# Patient Record
Sex: Male | Born: 1990 | Race: Black or African American | Hispanic: No | Marital: Single | Smoking: Never smoker
Health system: Southern US, Community
[De-identification: ages and names within clinical notes are randomized; demographics above are authoritative.]

## PROBLEM LIST (undated history)

## (undated) DIAGNOSIS — R55 Syncope and collapse: Secondary | ICD-10-CM

## (undated) DIAGNOSIS — I499 Cardiac arrhythmia, unspecified: Secondary | ICD-10-CM

## (undated) DIAGNOSIS — E119 Type 2 diabetes mellitus without complications: Secondary | ICD-10-CM

## (undated) DIAGNOSIS — R002 Palpitations: Secondary | ICD-10-CM

## (undated) DIAGNOSIS — E669 Obesity, unspecified: Secondary | ICD-10-CM

---

## 2016-09-18 ENCOUNTER — Other Ambulatory Visit: Payer: Self-pay | Admitting: Ophthalmology

## 2016-09-18 DIAGNOSIS — H209 Unspecified iridocyclitis: Secondary | ICD-10-CM

## 2016-09-20 ENCOUNTER — Other Ambulatory Visit
Admission: RE | Admit: 2016-09-20 | Discharge: 2016-09-20 | Disposition: A | Discharge status: Home / Self Care | Payer: 59 | Source: Ambulatory Visit | Attending: Ophthalmology | Admitting: Ophthalmology

## 2016-09-20 DIAGNOSIS — H209 Unspecified iridocyclitis: Secondary | ICD-10-CM | POA: Insufficient documentation

## 2016-09-20 LAB — CREATININE, SERUM: CREATININE: 1.04 mg/dL (ref 0.61–1.24)

## 2016-09-20 LAB — URINALYSIS COMPLETE WITH MICROSCOPIC (ARMC ONLY)
BACTERIA UA: NONE SEEN
BILIRUBIN URINE: NEGATIVE
Glucose, UA: NEGATIVE mg/dL
HGB URINE DIPSTICK: NEGATIVE
Ketones, ur: NEGATIVE mg/dL
Nitrite: NEGATIVE
PH: 5 (ref 5.0–8.0)
PROTEIN: NEGATIVE mg/dL
Specific Gravity, Urine: 1.019 (ref 1.005–1.030)

## 2016-09-21 LAB — RPR: RPR: NONREACTIVE

## 2016-09-21 LAB — FLUORESCENT TREPONEMAL AB(FTA)-IGG-BLD: FLUORESCENT TREPONEMAL AB, IGG: NONREACTIVE

## 2016-09-24 LAB — QUANTIFERON IN TUBE
QFT TB AG MINUS NIL VALUE: 0.03 [IU]/mL
QUANTIFERON MITOGEN VALUE: 8.35 [IU]/mL
QUANTIFERON TB AG VALUE: 0.06 [IU]/mL
QUANTIFERON TB GOLD: NEGATIVE
Quantiferon Nil Value: 0.03 IU/mL

## 2016-09-24 LAB — QUANTIFERON TB GOLD ASSAY (BLOOD)

## 2016-09-25 ENCOUNTER — Ambulatory Visit
Admission: RE | Admit: 2016-09-25 | Discharge: 2016-09-25 | Disposition: A | Discharge status: Home / Self Care | Payer: 59 | Source: Ambulatory Visit | Attending: Ophthalmology | Admitting: Ophthalmology

## 2016-09-25 DIAGNOSIS — H209 Unspecified iridocyclitis: Secondary | ICD-10-CM

## 2016-09-26 LAB — MISC LABCORP TEST (SEND OUT): LABCORP TEST CODE: 6924

## 2017-02-25 ENCOUNTER — Ambulatory Visit
Admission: EM | Admit: 2017-02-25 | Discharge: 2017-02-25 | Disposition: A | Discharge status: Home / Self Care | Payer: 59 | Attending: Family Medicine | Admitting: Family Medicine

## 2017-02-25 DIAGNOSIS — H6982 Other specified disorders of Eustachian tube, left ear: Secondary | ICD-10-CM

## 2017-02-25 MED ORDER — FLUTICASONE PROPIONATE 50 MCG/ACT NA SUSP: 2.0000 | Freq: Every day | NASAL | 0 refills | Status: DC

## 2017-02-25 NOTE — ED Provider Notes (Signed)
CSN: [id number]     Arrival date & time 02/25/17  [id number] History   First MD Initiated Contact with Patient 02/25/17 0914     Chief Complaint  Patient presents with  . Otalgia    left ear   (Consider location/radiation/quality/duration/timing/severity/associated sxs/prior Treatment) HPI  This a 26 year old male who presents with persistent pain in his left ear that is worsened over short period of time. He states that hearing is now muffled. He states that he feels like there is water in there and he can  hear sloshing. Over the counter ear drops and medications have not been beneficial. He denies any immersion in water. He has not been flying or at altitude.     History reviewed. No pertinent past medical history. History reviewed. No pertinent surgical history. Family History  Problem Relation Age of Onset  . Coronary artery disease Father   . Hypertension Father    Social History  Substance Use Topics  . Smoking status: Never Smoker  . Smokeless tobacco: Never Used  . Alcohol use Yes    Review of Systems  Constitutional: Negative for activity change, appetite change, chills, fatigue and fever.  HENT: Positive for ear pain. Negative for ear discharge.   All other systems reviewed and are negative.   Allergies  Patient has no known allergies.  Home Medications   Prior to Admission medications   Medication Sig Start Date End Date Taking? Authorizing Provider  fluticasone (FLONASE) 50 MCG/ACT nasal spray Place 2 sprays into both nostrils daily. 02/25/17   Lutricia FeilWilliam P [place], PA-C   Meds Ordered and Administered this Visit  Medications - No data to display  BP 125/88 (BP Location: Left Arm)   Pulse 81   Temp 98.2 F (36.8 C) (Oral)   Resp 18   Ht 6\' 2"  (1.88 m)   Wt 270 lb (122.5 kg)   SpO2 100%   BMI 34.67 kg/m  No data found.   Physical Exam  Constitutional: He is oriented to person, place, and time. He appears well-developed and well-nourished. No distress.   HENT:  Head: Normocephalic and atraumatic.  Right Ear: External ear normal.  Left Ear: External ear normal.  Left ear canal is clean. There is no cerumen present. TM is normal appearing with a mild decrease of the light reflex and some retraction.  Eyes: EOM are normal. Pupils are equal, round, and reactive to light. Right eye exhibits no discharge. Left eye exhibits no discharge.  Neck: Normal range of motion. Neck supple.  Musculoskeletal: Normal range of motion.  Lymphadenopathy:    He has no cervical adenopathy.  Neurological: He is alert and oriented to person, place, and time.  Skin: Skin is warm and dry. He is not diaphoretic.  Psychiatric: He has a normal mood and affect. His behavior is normal. Judgment and thought content normal.  Nursing note and vitals reviewed.   Urgent Care Course     Procedures (including critical care time)  Labs Review Labs Reviewed - No data to display  Imaging Review No results found.   Visual Acuity Review  Right Eye Distance:   Left Eye Distance:   Bilateral Distance:    Right Eye Near:   Left Eye Near:    Bilateral Near:         MDM   1. Eustachian tube dysfunction, left    Discharge Medication List as of 02/25/2017  9:27 AM    START taking these medications   Details  fluticasone (FLONASE)  50 MCG/ACT nasal spray Place 2 sprays into both nostrils daily., Starting Mon 02/25/2017, Normal      Plan: 1. Test/x-ray results and diagnosis reviewed with patient 2. rx as per orders; risks, benefits, potential side effects reviewed with patient 3. Recommend supportive treatment with Daily use of Flonase which was instructed to the patient. May consider antihistamines for augmentation. Is not improving within 2 weeks he should follow-up with Dr. Genevive Bi. 4. F/u prn if symptoms worsen or don't improve     Lutricia Feil, PA-C 02/25/17 [phone]

## 2017-02-25 NOTE — ED Triage Notes (Addendum)
Pt c/o persistent pain in left ear, and gradually getting worse and having some difficulty hearing. 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 He says it feels like there is water in there. He has tried OTC drops with no relief.

## 2017-12-05 ENCOUNTER — Ambulatory Visit
Admission: EM | Admit: 2017-12-05 | Discharge: 2017-12-05 | Disposition: A | Discharge status: Home / Self Care | Payer: 59 | Attending: Internal Medicine | Admitting: Internal Medicine

## 2017-12-05 ENCOUNTER — Other Ambulatory Visit: Payer: Self-pay

## 2017-12-05 DIAGNOSIS — J029 Acute pharyngitis, unspecified: Secondary | ICD-10-CM

## 2017-12-05 DIAGNOSIS — M542 Cervicalgia: Secondary | ICD-10-CM

## 2017-12-05 HISTORY — DX: Cardiac arrhythmia, unspecified: I49.9

## 2017-12-05 LAB — RAPID STREP SCREEN (MED CTR MEBANE ONLY): Streptococcus, Group A Screen (Direct): NEGATIVE

## 2017-12-05 MED ORDER — PENICILLIN G BENZATHINE 1200000 UNIT/2ML IM SUSP
1.2000 10*6.[IU] | Freq: Once | INTRAMUSCULAR | Status: AC
  Administered 2017-12-05: 1.2 10*6.[IU] via INTRAMUSCULAR

## 2017-12-05 MED ORDER — DEXAMETHASONE SODIUM PHOSPHATE 10 MG/ML IJ SOLN
10.0000 mg | Freq: Once | INTRAMUSCULAR | Status: AC
  Administered 2017-12-05: 10 mg via INTRAMUSCULAR

## 2017-12-05 NOTE — ED Provider Notes (Signed)
[facility]    CSN: [id number] Arrival date & time: 12/05/17  1847     History   Chief Complaint Chief Complaint  Patient presents with  . Sore Throat    HPI Antonio Peters is a 26 y.o. male.   Patient is a 26 year old male who presents with complaint of severe sore throat pain for 8-10 days. Patient's pain is rated 10 out of 10 scale. wife was treated for strep throat last week and the patient's son was found to be positive for strep throat yesterday. Patient also reports fever, headache, neck pain and cough. Patient also reports a tightness from level and noticed down to lower chest. 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 Patient states ibuprofen helps for about one hour which he has been taken more nighttime to help sleep he also been taking garlic water as well as EmergenC. Patient reports severe neck pain and back pain. His throat pain is worse with swallowing and cough. Patient denies any shortness of breath.      Past Medical History:  Diagnosis Date  . Irregular heartbeat     There are no active problems to display for this patient.   History reviewed. No pertinent surgical history.     Home Medications    Prior to Admission medications   Medication Sig Start Date End Date Taking? Authorizing Provider  fluticasone (FLONASE) 50 MCG/ACT nasal spray Place 2 sprays into both nostrils daily. 02/25/17   Lutricia Feiloemer, William P, PA-C    Family History Family History  Problem Relation Age of Onset  . Coronary artery disease Father   . Hypertension Father     Social History Social History   Tobacco Use  . Smoking status: Never Smoker  . Smokeless tobacco: Never Used  Substance Use Topics  . Alcohol use: Yes  . Drug use: No     Allergies   Patient has no known allergies.   Review of Systems Review of Systems  As noted above in  history of present illness. Other systems reviewed and found to be negative.   Physical Exam Triage Vital Signs ED Triage Vitals  Enc Vitals Group     BP 12/05/17 1900 120/80     Pulse Rate 12/05/17 1900 (!) 117     Resp 12/05/17 1900 (!) 22     Temp 12/05/17 1900 99.3 F (37.4 C)     Temp Source 12/05/17 1900 Oral     SpO2 12/05/17 1900 100 %     Weight 12/05/17 1903 275 lb (124.7 kg)     Height 12/05/17 1903 6\' 1"  (1.854 m)     Head Circumference --      Peak Flow --      Pain Score 12/05/17 1903 10     Pain Loc --      Pain Edu? --      Excl. in GC? --    No data found.  Updated Vital Signs BP 120/80 (BP Location: Right Arm)   Pulse (!) 117   Temp 99.3 F (37.4 C) (Oral)   Resp (!) 22   Ht 6\' 1"  (1.854 m)   Wt 275 lb (124.7 kg)   SpO2 100%   BMI 36.28 kg/m    Physical Exam  Constitutional: He is oriented to person, place, and time. He appears well-developed and well-nourished. He appears ill.  HENT:  Head: Normocephalic and atraumatic.  Nose: Right sinus exhibits maxillary sinus tenderness and frontal sinus tenderness. Left sinus exhibits maxillary sinus tenderness and frontal sinus tenderness.  Mouth/Throat: Posterior oropharyngeal erythema (bright red) present. Tonsils are 1+ on the right. Tonsils are 1+ on the left.  Eyes: EOM are normal. Pupils are equal, round, and reactive to light.  Neck:  Tenderness to neck with palpation. Decreased range of motion secondary to pain.  Cardiovascular: Regular rhythm. Tachycardia present.  Pulmonary/Chest: Effort normal and breath sounds normal. No stridor. No respiratory distress. He has no wheezes.  Abdominal: Soft. Bowel sounds are normal.  Lymphadenopathy:    He has cervical adenopathy.  Neurological: He is alert and oriented to person, place, and time.  Skin: Skin is warm and dry.  Question whether actual severe pain or exaggeration in response to palpation of the maxillary sinus with patient nearly coming up out of  the exam chair with palpation. Wife reported to nurse that he seemed to be exaggerating somewhat.   UC Treatments / Results  Labs (all labs ordered are listed, but only abnormal results are displayed) Labs Reviewed  RAPID STRE[facility]CREEN (NOT [facility])  CULTURE, GROUP A STREP [facility])    EKG  EKG Interpretation None       Radiology No results found.  Procedures Procedures (including critical care time)  Medications Ordered in UC Medications  penicillin g benzathine (BICILLIN LA) 1200000 UNIT/2ML injection 1.2 Million Units (not administered)  dexamethasone (DECADRON) injection 10 mg (not administered)     Initial Impression / Assessment and Plan / UC Course  I have reviewed the triage vital signs and the nursing notes.  Pertinent labs & imaging results that were available during my care of the patient were reviewed by me and considered in my medical decision making (see chart for details).    Patient with complaint of severe sore throat 8-9 days much worse the last 7. Wife and child both with positive strep throat. Patient with complaint of neck tenderness as well as tightness around the chest area. Rapid strep done . Final Clinical Impressions(s) / UC Diagnoses   Final diagnoses:  Sore throat   Patient with severe sore throat with swelling and erythema noted. Wife and child both are positive for strep throat are being treated with antibiotics. Plan treat patient with antibiotics and patient has agreed with treatment with Bicillin injection set of pills due to his swallowing pain. Patient also given a injection of Decadron 1 to help with inflammation.  ED Discharge Orders    None       Controlled Substance Prescriptions [place] Controlled Substance Registry consulted? Not Applicable   Candis SchatzHarris, Allyn Bertoni D, PA-C 12/05/17 1931

## 2017-12-05 NOTE — Discharge Instructions (Signed)
-  tylenol and ibuprofen for pain -warm salt water gargles -OTC lozenges -you were given an injection of an antibiotic called Bicillin. This should treat your likely infection -You were also given an injection of dexamethasone. This is a steroid to help with your inflammation.  -return to clinic or follow up with PCP if symptoms do not improve or should worsen.

## 2017-12-05 NOTE — ED Triage Notes (Signed)
Pt's wife diagnosed with strep last week. Pt with sx since last Friday. Severe sore throat and feels swollen. Reports fever. Pain 10/10

## 2017-12-08 LAB — CULTURE, GROUP A STREP (THRC)

## 2018-11-30 ENCOUNTER — Emergency Department
Admission: EM | Admit: 2018-11-30 | Discharge: 2018-11-30 | Disposition: A | Discharge status: Home / Self Care | Payer: PRIVATE HEALTH INSURANCE | Attending: Emergency Medicine | Admitting: Emergency Medicine

## 2018-11-30 ENCOUNTER — Other Ambulatory Visit: Payer: Self-pay

## 2018-11-30 DIAGNOSIS — H209 Unspecified iridocyclitis: Secondary | ICD-10-CM

## 2018-11-30 DIAGNOSIS — Z79899 Other long term (current) drug therapy: Secondary | ICD-10-CM | POA: Insufficient documentation

## 2018-11-30 DIAGNOSIS — H5711 Ocular pain, right eye: Secondary | ICD-10-CM | POA: Diagnosis present

## 2018-11-30 MED ORDER — PREDNISOLONE ACETATE 1 % OP SUSP: 2.0000 [drp] | Freq: Three times a day (TID) | OPHTHALMIC | 0 refills | Status: AC

## 2018-11-30 NOTE — ED Notes (Signed)
No peripheral IV placed this visit.   Discharge instructions reviewed with patient. Questions fielded by this RN. Patient verbalizes understanding of instructions. Patient discharged home in stable condition per Jackie, PA. No acute distress noted at time of discharge.   

## 2018-11-30 NOTE — ED Provider Notes (Signed)
[facility] Emergency Department Provider Note  ____________________________________________  Time seen: Approximately 7:19 PM  I have reviewed the triage vital signs and the nursing notes.   HISTORY  Chief Complaint Eye Pain    HPI Antonio Peters is a 27 y.o. male presents to the emergency department with right eye pain for the past 2 to 3 days.  Patient has a history of uveitis and has experienced similar symptoms in the past.  Patient has light sensitivity but no increased tearing.  No foreign body sensation or crusting or matting of the eye lashes.  Patient denies right eye trauma.  He does not wear contact lenses.  Patient reports that he recently moved to [place] from [place] and was previously under the care of ophthalmology.  Patient reports that he underwent an extensive work-up to be diagnosed with uveitis and was managed with a topical steroid drop.  No visual loss since symptoms started.  No alleviating measures have been attempted.   Past Medical History:  Diagnosis Date  . Irregular heartbeat   . Irregular heartbeat     There are no active problems to display for this patient.   History reviewed. No pertinent surgical history.  Prior to Admission medications   Medication Sig Start Date End Date Taking? Authorizing Provider  fluticasone (FLONASE) 50 MCG/ACT nasal spray Place 2 sprays into both nostrils daily. 02/25/17   Lutricia Feil, PA-C  prednisoLONE acetate (PRED FORTE) 1 % ophthalmic suspension Place 2 drops into the right eye 3 (three) times daily for 5 days. 11/30/18 12/05/18  Orvil Feil, PA-C    Allergies Patient has no known allergies.  Family History  Problem Relation Age of Onset  . Coronary artery disease Father   . Hypertension Father     Social History Social History   Tobacco Use  . Smoking status: Never Smoker  . Smokeless tobacco: Never Used  Substance Use Topics  . Alcohol use: Not Currently  .  Drug use: No     Review of Systems  Constitutional: No fever/chills Eyes: Patient has right eye pain. ENT: No upper respiratory complaints. Cardiovascular: no chest pain. Respiratory: no cough. No SOB. Gastrointestinal: No abdominal pain.  No nausea, no vomiting.  No diarrhea.  No constipation. Genitourinary: Negative for dysuria. No hematuria Musculoskeletal: Negative for musculoskeletal pain. Skin: Negative for rash, abrasions, lacerations, ecchymosis. Neurological: Negative for headaches, focal weakness or numbness.  ____________________________________________   PHYSICAL EXAM:  VITAL SIGNS: ED Triage Vitals  Enc Vitals Group     BP 11/30/18 1657 121/84     Pulse Rate 11/30/18 1657 94     Resp 11/30/18 1657 17     Temp 11/30/18 1657 98.2 F (36.8 C)     Temp Source 11/30/18 1657 Oral     SpO2 11/30/18 1657 97 %     Weight 11/30/18 1658 270 lb (122.5 kg)     Height 11/30/18 1658 6\' 3"  (1.905 m)     Head Circumference --      Peak Flow --      Pain Score 11/30/18 1657 7     Pain Loc --      Pain Edu? --      Excl. in GC? --      Constitutional: Alert and oriented. Well appearing and in no acute distress. Eyes: Patient has perilimbal redness and scleral injection of the right eye.  PERRL. EOMI. accommodation intact.  Peripheral vision intact. Head: Atraumatic. ENT:  Ears: TMs are pearly.      Nose: No congestion/rhinnorhea.      Mouth/Throat: Mucous membranes are moist.  Neck: No stridor.  No cervical spine tenderness to palpation. Hematological/Lymphatic/Immunilogical: No cervical lymphadenopathy.  Cardiovascular: Normal rate, regular rhythm. Normal S1 and S2.  Good peripheral circulation. Respiratory: Normal respiratory effort without tachypnea or retractions. Lungs CTAB. Good air entry to the bases with no decreased or absent breath sounds. Skin:  Skin is warm, dry and intact. No rash noted. Psychiatric: Mood and affect are normal. Speech and behavior are  normal. Patient exhibits appropriate insight and judgement.   ____________________________________________   LABS (all labs ordered are listed, but only abnormal results are displayed)  Labs Reviewed - No data to display ____________________________________________  EKG   ____________________________________________  RADIOLOGY   No results found.  ____________________________________________    PROCEDURES  Procedure(s) performed:    Procedures    Medications - No data to display   ____________________________________________   INITIAL IMPRESSION / ASSESSMENT AND PLAN / ED COURSE  Pertinent labs & imaging results that were available during my care of the patient were reviewed by me and considered in my medical decision making (see chart for details).  Review of the [place] CSRS was performed in accordance of the [facility] prior to dispensing any controlled drugs.      Assessment and plan Uveitis Patient presents to the emergency department with right eye photophobia and discomfort for the past 2 to 3 days.  On physical exam, patient had perilimbal redness and scleral injection. Patient reports that he has experienced the exact same symptoms in the past when diagnosed with uveitis.  Patient denies changes in visual acuity or foreign body sensation.  Patient was discharged with prednisolone ophthalmic solution as patient has tolerated medication well in the past.  Patient was referred to ophthalmology.  Vital signs are reassuring prior to discharge.  All patient questions were answered.    ____________________________________________  FINAL CLINICAL IMPRESSION(S) / ED DIAGNOSES  Final diagnoses:  Uveitis      NEW MEDICATIONS STARTED DURING THIS VISIT:  ED Discharge Orders         Ordered    prednisoLONE acetate (PRED FORTE) 1 % ophthalmic suspension  3 times daily     11/30/18 1917              This chart was dictated using voice recognition  software/Dragon. Despite best efforts to proofread, errors can occur which can change the meaning. Any change was purely unintentional.    Orvil FeilWoods, Yelina Sarratt M, PA-C 11/30/18 1924    Jene EveryKinner, Robert, MD 11/30/18 2016

## 2018-11-30 NOTE — ED Notes (Signed)
Pt states right eye pain/discomfort. Pt states sensitivity to light. Pt denies vision changes.

## 2018-11-30 NOTE — ED Triage Notes (Signed)
Pt c/o sensitivity to light in right eye only - pt right eye is red and has been since Thursday - denies drainage - denies itching or irritation

## 2020-08-17 ENCOUNTER — Other Ambulatory Visit: Payer: PRIVATE HEALTH INSURANCE

## 2020-08-17 ENCOUNTER — Other Ambulatory Visit: Payer: Self-pay

## 2020-08-17 DIAGNOSIS — Z20822 Contact with and (suspected) exposure to covid-19: Secondary | ICD-10-CM | POA: Diagnosis not present

## 2020-08-18 LAB — NOVEL CORONAVIRUS, NAA: SARS-CoV-2, NAA: DETECTED — AB

## 2020-08-19 ENCOUNTER — Encounter: Payer: Self-pay | Admitting: Oncology

## 2020-08-19 ENCOUNTER — Telehealth: Payer: Self-pay | Admitting: Oncology

## 2020-08-19 NOTE — Telephone Encounter (Signed)
Called to Discuss with patient about Covid symptoms and the use of regeneron, a monoclonal antibody infusion for those with mild to moderate Covid symptoms and at a high risk of hospitalization.     Pt is qualified for this infusion at the [facility] infusion center due to co-morbid conditions and/or a member of an at-risk group.     Unable to reach pt. Left message to return call and Mychart message.   Past Medical History:  Diagnosis Date  . Irregular heartbeat   . Irregular heartbeat    Mignon Pine, AGNP-C [phone] (Infusion Center Hotline)

## 2020-08-24 ENCOUNTER — Other Ambulatory Visit: Payer: PRIVATE HEALTH INSURANCE

## 2020-08-24 ENCOUNTER — Other Ambulatory Visit: Payer: Self-pay

## 2020-08-24 DIAGNOSIS — Z20822 Contact with and (suspected) exposure to covid-19: Secondary | ICD-10-CM

## 2020-08-26 LAB — NOVEL CORONAVIRUS, NAA: SARS-CoV-2, NAA: DETECTED — AB

## 2020-08-26 LAB — SARS-COV-2, NAA 2 DAY TAT

## 2020-08-29 ENCOUNTER — Other Ambulatory Visit: Payer: Self-pay

## 2020-08-29 ENCOUNTER — Other Ambulatory Visit: Payer: Self-pay | Admitting: Critical Care Medicine

## 2020-08-29 DIAGNOSIS — Z20822 Contact with and (suspected) exposure to covid-19: Secondary | ICD-10-CM | POA: Diagnosis not present

## 2020-08-30 LAB — SARS-COV-2, NAA 2 DAY TAT

## 2020-08-30 LAB — NOVEL CORONAVIRUS, NAA: SARS-CoV-2, NAA: NOT DETECTED

## 2021-07-22 ENCOUNTER — Other Ambulatory Visit: Payer: Self-pay

## 2021-07-22 ENCOUNTER — Ambulatory Visit
Admission: EM | Admit: 2021-07-22 | Discharge: 2021-07-22 | Disposition: A | Discharge status: Home / Self Care | Payer: BC Managed Care – PPO | Attending: Sports Medicine | Admitting: Sports Medicine

## 2021-07-22 DIAGNOSIS — L299 Pruritus, unspecified: Secondary | ICD-10-CM

## 2021-07-22 DIAGNOSIS — T7840XA Allergy, unspecified, initial encounter: Secondary | ICD-10-CM

## 2021-07-22 DIAGNOSIS — L235 Allergic contact dermatitis due to other chemical products: Secondary | ICD-10-CM

## 2021-07-22 DIAGNOSIS — R21 Rash and other nonspecific skin eruption: Secondary | ICD-10-CM

## 2021-07-22 HISTORY — DX: Obesity, unspecified: E66.9

## 2021-07-22 MED ORDER — HYDROXYZINE HCL 25 MG PO TABS: 25.0000 mg | ORAL_TABLET | Freq: Four times a day (QID) | ORAL | 0 refills | Status: DC

## 2021-07-22 MED ORDER — METHYLPREDNISOLONE SODIUM SUCC 125 MG IJ SOLR
125.0000 mg | Freq: Once | INTRAMUSCULAR | Status: AC
  Administered 2021-07-22: 125 mg via INTRAMUSCULAR

## 2021-07-22 MED ORDER — PREDNISONE 10 MG (21) PO TBPK: ORAL_TABLET | Freq: Every day | ORAL | 0 refills | Status: DC

## 2021-07-22 NOTE — Discharge Instructions (Addendum)
As we discussed, I gave you a steroid shot today. Also prescribed 12 days of steroids as a taper.  Please complete the entire course. I prescribed you a medication for your itch that is nonsedating that you can use during the daytime.  You can use Benadryl at night as it can make you little sleepy. Educational handouts provided. If you develop any fevers or the rash gets infected please come back here or go to another healthcare provider.  If it is after hours please go to the ER.

## 2021-07-22 NOTE — ED Triage Notes (Addendum)
Pt is here with a rash all over his body this started Monday, pt has taken Benadryl and cortisone cream to relieve discomfort. Pt states when he sweats it stings all over.

## 2021-07-22 NOTE — ED Provider Notes (Signed)
[facility]    CSN: [id number] Arrival date & time: 07/22/21  1242      History   Chief Complaint Chief Complaint  Patient presents with   Rash    HPI Antonio Peters is a 30 y.o. male.   30 year old male who presents for evaluation of a rash that began almost a week ago.  It is all over his body but worse on his arms, his upper back, and his lower back.  He has been taking some Benadryl and using some cortisone over-the-counter cream which he has had limited success with.  He reports that it does sting if he sweats a lot.  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  It is very itchy.  He relates it to a change that he made in detergents.  No other exposures that he is aware of.  No shortness of breath, lip swelling, difficulty swallowing, chest pain, or any symptoms of anaphylaxis.  No fever shakes chills.  No abscess formation.  No draining of any of the areas on his body.  He has never had this before.  He does not have a primary care provider.  No red flag signs or symptoms elicited on history.   Past Medical History:  Diagnosis Date   Irregular heartbeat    Irregular heartbeat    Obese     There are no problems to display for this patient.   History reviewed. No pertinent surgical history.     Home Medications    Prior to Admission medications   Medication Sig Start Date End Date Taking? Authorizing Provider  hydrOXYzine (ATARAX/VISTARIL) 25 MG tablet Take 1 tablet (25 mg total) by mouth every 6 (six) hours. 07/22/21  Yes Delton See, MD  predniSONE (STERAPRED UNI-PAK 21 TAB) 10 MG (21) TBPK tablet Take by mouth daily. Take 6 tabs by mouth daily  for 2 days, then 5 tabs for 2 days, then 4 tabs for 2 days, then 3 tabs for 2 days, 2 tabs for 2 days, then 1 tab by mouth daily for 2 days 07/22/21  Yes Delton See, MD  fluticasone [facility]) 50 MCG/ACT nasal spray Place 2 sprays into both nostrils daily. 02/25/17   Lutricia Feil, PA-C    Family History Family History  Problem Relation Age of Onset   Hypertension Mother    Coronary artery disease Father    Hypertension Father     Social History Social History   Tobacco Use   Smoking status: Never   Smokeless tobacco: Never  Vaping Use   Vaping Use: Never used  Substance Use Topics   Alcohol use: Not Currently   Drug use: No     Allergies   Patient has no known allergies.   Review of Systems Review of Systems  Constitutional:  Negative for activity change, appetite change, chills, diaphoresis, fatigue and fever.  HENT:  Negative for congestion, ear pain, postnasal drip, rhinorrhea, sinus pressure, sinus pain, sneezing, sore throat and trouble swallowing.   Eyes:  Negative for pain.  Respiratory:  Negative for cough, chest tightness, shortness of breath and wheezing.   Cardiovascular:  Negative for chest pain and palpitations.  Gastrointestinal:  Negative for abdominal pain, diarrhea, nausea and vomiting.  Genitourinary:  Negative for dysuria.  Musculoskeletal:  Negative for back pain, myalgias and neck pain.  Skin:  Positive for rash. Negative for color change, pallor and wound.  Neurological:  Negative  for dizziness, light-headedness, numbness and headaches.  All other systems reviewed and are negative.   Physical Exam Triage Vital Signs ED Triage Vitals  Enc Vitals Group     BP 07/22/21 1252 136/80     Pulse Rate 07/22/21 1252 100     Resp 07/22/21 1252 19     Temp 07/22/21 1252 98.6 F (37 C)     Temp Source 07/22/21 1252 Oral     SpO2 07/22/21 1252 99 %     Weight 07/22/21 1258 295 lb (133.8 kg)     Height --      Head Circumference --      Peak Flow --      Pain Score 07/22/21 1251 0     Pain Loc --      Pain Edu? --      Excl. in GC? --    No data found.  Updated Vital Signs BP 136/80 (BP Location: Right Arm)   Pulse 100   Temp 98.6 F (37 C) (Oral)   Resp 19   Wt 133.8 kg   SpO2 99%   BMI 36.87 kg/m    Visual Acuity Right Eye Distance:   Left Eye Distance:   Bilateral Distance:    Right Eye Near:   Left Eye Near:    Bilateral Near:     Physical Exam Vitals and nursing note reviewed.  Constitutional:      General: He is not in acute distress.    Appearance: Normal appearance. He is not ill-appearing, toxic-appearing or diaphoretic.  HENT:     Head: Normocephalic and atraumatic.     Nose: Nose normal. No congestion or rhinorrhea.     Mouth/Throat:     Mouth: Mucous membranes are moist.  Eyes:     Conjunctiva/sclera: Conjunctivae normal.     Pupils: Pupils are equal, round, and reactive to light.  Cardiovascular:     Rate and Rhythm: Normal rate and regular rhythm.     Pulses: Normal pulses.     Heart sounds: Normal heart sounds. No murmur heard.   No friction rub. No gallop.  Pulmonary:     Effort: Pulmonary effort is normal.     Breath sounds: Normal breath sounds. No stridor. No wheezing, rhonchi or rales.  Musculoskeletal:     Cervical back: Normal range of motion and neck supple.  Skin:    General: Skin is warm and dry.     Capillary Refill: Capillary refill takes less than 2 seconds.     Coloration: Skin is not jaundiced.     Findings: Rash present. No bruising, ecchymosis, erythema, signs of injury or wound.     Comments: Diffuse rash on arms, shoulders, and entire back.  It is a little bit raised.  No drainage or abscess formation.  Is consistent with an allergic reaction and contact dermatitis.  Neurological:     General: No focal deficit present.     Mental Status: He is alert and oriented to person, place, and time.     UC Treatments / Results  Labs (all labs ordered are listed, but only abnormal results are displayed) Labs Reviewed - No data to display  EKG   Radiology No results found.  Procedures Procedures (including critical care time)  Medications Ordered in UC Medications  methylPREDNISolone sodium succinate (SOLU-MEDROL) 125 mg/2 mL  injection 125 mg (125 mg Intramuscular Given 07/22/21 1420)    Initial Impression / Assessment and Plan / UC Course  I have reviewed  the triage vital signs and the nursing notes.  Pertinent labs & imaging results that were available during my care of the patient were reviewed by me and considered in my medical decision making (see chart for details).  Clinical impression: 1.  Diffuse rash 2.  Contact dermatitis 3.  Allergic reaction 4.  Pruritus.  Treatment plan: 1.  The findings and treatment plan were discussed in detail with the patient.  Patient was in agreement. 2.  Given the diffuseness of the rash I would go to treat him with steroids.  Solu-Medrol 125 mg IM was given in the office.  I also prescribed a 12-day prednisone taper that he will begin tomorrow. 3.  Gave him Atarax for his itch that he can use during the daytime.  He can use Benadryl oral at nighttime.  Clearly the diffuseness he does not need to be using a topical steroid or Benadryl at this time. 4.  Educational handouts provided. 5.  I did go over the fact if he develops any fevers or he thinks the rash is getting infected that he should come back here or go to another healthcare provider as he does not have a primary care provider.  If it was after hours he should go to the emergency room.  He voiced verbal understanding. 6.  He was discharged in stable condition and will follow-up here as needed.    Final Clinical Impressions(s) / UC Diagnoses   Final diagnoses:  Allergic reaction, initial encounter  Rash and nonspecific skin eruption  Allergic dermatitis due to other chemical product  Pruritus     Discharge Instructions      As we discussed, I gave you a steroid shot today. Also prescribed 12 days of steroids as a taper.  Please complete the entire course. I prescribed you a medication for your itch that is nonsedating that you can use during the daytime.  You can use Benadryl at night as it can make you  little sleepy. Educational handouts provided. If you develop any fevers or the rash gets infected please come back here or go to another healthcare provider.  If it is after hours please go to the ER.   ED Prescriptions     Medication Sig Dispense Auth. Provider   predniSONE (STERAPRED UNI-PAK 21 TAB) 10 MG (21) TBPK tablet Take by mouth daily. Take 6 tabs by mouth daily  for 2 days, then 5 tabs for 2 days, then 4 tabs for 2 days, then 3 tabs for 2 days, 2 tabs for 2 days, then 1 tab by mouth daily for 2 days 42 tablet Delton See, MD   hydrOXYzine (ATARAX/VISTARIL) 25 MG tablet Take 1 tablet (25 mg total) by mouth every 6 (six) hours. 12 tablet Delton See, MD      PDMP not reviewed this encounter.   Delton See, MD 07/22/21 [phone]

## 2021-08-06 ENCOUNTER — Emergency Department: Payer: BC Managed Care – PPO

## 2021-08-06 ENCOUNTER — Other Ambulatory Visit: Payer: Self-pay

## 2021-08-06 ENCOUNTER — Inpatient Hospital Stay
Admission: EM | Admit: 2021-08-06 | Discharge: 2021-08-08 | DRG: 684 | Disposition: A | Discharge status: Home / Self Care | Payer: BC Managed Care – PPO | Attending: Internal Medicine | Admitting: Internal Medicine

## 2021-08-06 ENCOUNTER — Inpatient Hospital Stay: Payer: BC Managed Care – PPO

## 2021-08-06 DIAGNOSIS — R531 Weakness: Secondary | ICD-10-CM | POA: Diagnosis not present

## 2021-08-06 DIAGNOSIS — Z6837 Body mass index (BMI) 37.0-37.9, adult: Secondary | ICD-10-CM

## 2021-08-06 DIAGNOSIS — E669 Obesity, unspecified: Secondary | ICD-10-CM

## 2021-08-06 DIAGNOSIS — R42 Dizziness and giddiness: Secondary | ICD-10-CM | POA: Diagnosis not present

## 2021-08-06 DIAGNOSIS — Z8249 Family history of ischemic heart disease and other diseases of the circulatory system: Secondary | ICD-10-CM | POA: Diagnosis not present

## 2021-08-06 DIAGNOSIS — R739 Hyperglycemia, unspecified: Secondary | ICD-10-CM | POA: Diagnosis present

## 2021-08-06 DIAGNOSIS — I493 Ventricular premature depolarization: Secondary | ICD-10-CM | POA: Diagnosis not present

## 2021-08-06 DIAGNOSIS — R55 Syncope and collapse: Secondary | ICD-10-CM | POA: Diagnosis present

## 2021-08-06 DIAGNOSIS — E161 Other hypoglycemia: Secondary | ICD-10-CM | POA: Diagnosis not present

## 2021-08-06 DIAGNOSIS — H538 Other visual disturbances: Secondary | ICD-10-CM | POA: Diagnosis not present

## 2021-08-06 DIAGNOSIS — R079 Chest pain, unspecified: Secondary | ICD-10-CM | POA: Diagnosis not present

## 2021-08-06 DIAGNOSIS — N179 Acute kidney failure, unspecified: Secondary | ICD-10-CM | POA: Diagnosis not present

## 2021-08-06 DIAGNOSIS — Z20822 Contact with and (suspected) exposure to covid-19: Secondary | ICD-10-CM | POA: Diagnosis present

## 2021-08-06 DIAGNOSIS — R7989 Other specified abnormal findings of blood chemistry: Secondary | ICD-10-CM | POA: Diagnosis not present

## 2021-08-06 DIAGNOSIS — R21 Rash and other nonspecific skin eruption: Secondary | ICD-10-CM | POA: Diagnosis not present

## 2021-08-06 DIAGNOSIS — R0789 Other chest pain: Secondary | ICD-10-CM | POA: Diagnosis present

## 2021-08-06 DIAGNOSIS — R002 Palpitations: Secondary | ICD-10-CM | POA: Diagnosis not present

## 2021-08-06 DIAGNOSIS — R944 Abnormal results of kidney function studies: Secondary | ICD-10-CM | POA: Diagnosis not present

## 2021-08-06 DIAGNOSIS — E162 Hypoglycemia, unspecified: Secondary | ICD-10-CM | POA: Diagnosis not present

## 2021-08-06 HISTORY — DX: Palpitations: R00.2

## 2021-08-06 HISTORY — DX: Syncope and collapse: R55

## 2021-08-06 HISTORY — DX: Type 2 diabetes mellitus without complications: E11.9

## 2021-08-06 LAB — TSH: TSH: 1.361 u[IU]/mL (ref 0.350–4.500)

## 2021-08-06 LAB — CBC
HCT: 40.9 % (ref 39.0–52.0)
Hemoglobin: 14.3 g/dL (ref 13.0–17.0)
MCH: 27 pg (ref 26.0–34.0)
MCHC: 35 g/dL (ref 30.0–36.0)
MCV: 77.2 fL — ABNORMAL LOW (ref 80.0–100.0)
Platelets: 341 10*3/uL (ref 150–400)
RBC: 5.3 MIL/uL (ref 4.22–5.81)
RDW: 14.7 % (ref 11.5–15.5)
WBC: 11.7 10*3/uL — ABNORMAL HIGH (ref 4.0–10.5)
nRBC: 0 % (ref 0.0–0.2)

## 2021-08-06 LAB — URINALYSIS, ROUTINE W REFLEX MICROSCOPIC
Bilirubin Urine: NEGATIVE
Glucose, UA: NEGATIVE mg/dL
Hgb urine dipstick: NEGATIVE
Ketones, ur: NEGATIVE mg/dL
Leukocytes,Ua: NEGATIVE
Nitrite: NEGATIVE
Protein, ur: NEGATIVE mg/dL
Specific Gravity, Urine: 1.025 (ref 1.005–1.030)
pH: 7 (ref 5.0–8.0)

## 2021-08-06 LAB — BASIC METABOLIC PANEL
Anion gap: 7 (ref 5–15)
BUN: 19 mg/dL (ref 6–20)
CO2: 23 mmol/L (ref 22–32)
Calcium: 9.1 mg/dL (ref 8.9–10.3)
Chloride: 108 mmol/L (ref 98–111)
Creatinine, Ser: 1.39 mg/dL — ABNORMAL HIGH (ref 0.61–1.24)
GFR, Estimated: >60 mL/min (ref >60)
Glucose, Bld: 109 mg/dL — ABNORMAL HIGH (ref 70–99)
Potassium: 3.7 mmol/L (ref 3.5–5.1)
Sodium: 138 mmol/L (ref 135–145)

## 2021-08-06 LAB — HEPATIC FUNCTION PANEL
ALT: 31 U/L (ref 0–44)
AST: 26 U/L (ref 15–41)
Albumin: 3.6 g/dL (ref 3.5–5.0)
Alkaline Phosphatase: 44 U/L (ref 38–126)
Bilirubin, Direct: 0.1 mg/dL (ref 0.0–0.2)
Indirect Bilirubin: 0.8 mg/dL (ref 0.3–0.9)
Total Bilirubin: 0.9 mg/dL (ref 0.3–1.2)
Total Protein: 6.9 g/dL (ref 6.5–8.1)

## 2021-08-06 LAB — TROPONIN I (HIGH SENSITIVITY)
Troponin I (High Sensitivity): 5 ng/L (ref <18)
Troponin I (High Sensitivity): 5 ng/L (ref <18)

## 2021-08-06 LAB — CBG MONITORING, ED: Glucose-Capillary: 120 mg/dL — ABNORMAL HIGH (ref 70–99)

## 2021-08-06 LAB — T4, FREE: Free T4: 1.08 ng/dL (ref 0.61–1.12)

## 2021-08-06 LAB — D-DIMER, QUANTITATIVE: D-Dimer, Quant: 1.29 ug/mL-FEU — ABNORMAL HIGH (ref 0.00–0.50)

## 2021-08-06 LAB — MAGNESIUM: Magnesium: 2.1 mg/dL (ref 1.7–2.4)

## 2021-08-06 LAB — SEDIMENTATION RATE: Sed Rate: 4 mm/hr (ref 0–15)

## 2021-08-06 LAB — FERRITIN: Ferritin: 191 ng/mL (ref 24–336)

## 2021-08-06 MED ORDER — HEPARIN SODIUM (PORCINE) 5000 UNIT/ML IJ SOLN
5000.0000 [IU] | Freq: Three times a day (TID) | INTRAMUSCULAR | Status: DC
  Filled 2021-08-06 (×2): qty 1

## 2021-08-06 MED ORDER — SODIUM CHLORIDE 0.9% FLUSH
3.0000 mL | Freq: Two times a day (BID) | INTRAVENOUS | Status: DC
  Administered 2021-08-06 – 2021-08-07 (×2): 3 mL via INTRAVENOUS

## 2021-08-06 MED ORDER — SODIUM CHLORIDE 0.9 % IV SOLN: INTRAVENOUS | Status: AC

## 2021-08-06 MED ORDER — LACTATED RINGERS IV BOLUS
1000.0000 mL | Freq: Once | INTRAVENOUS | Status: AC
  Administered 2021-08-06: 1000 mL via INTRAVENOUS

## 2021-08-06 NOTE — ED Provider Notes (Signed)
[facility] Emergency Department Provider Note   ____________________________________________    I have reviewed the triage vital signs and the nursing notes.   HISTORY  Chief Complaint Chest Pain     HPI Antonio Peters is a 30 y.o. male with a reported history of diabetes although patient is not on any medications presents with complaints of chest discomfort palpitations near syncope, diaphoresis.  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  Is feeling somewhat better now, no longer having chest discomfort or palpitations but still feels extremely weak.  He reports similar episode occurred 1 week ago but not nearly as bad  Past Medical History:  Diagnosis Date   Diabetes mellitus without complication (HCC)    Irregular heartbeat    Irregular heartbeat    Obese     Patient Active Problem List   Diagnosis Date Noted   Syncope and collapse 08/06/2021   Dizziness 08/06/2021   Blurred vision, left eye 08/06/2021   Rash 08/06/2021   Abnormal renal function test 08/06/2021   Elevated serum glucose 08/06/2021   Obesity 08/06/2021    History reviewed. No pertinent surgical history.  Prior to Admission medications   Medication Sig Start Date End Date Taking? Authorizing Provider  fluticasone (FLONASE) 50 MCG/ACT nasal spray Place 2 sprays into both nostrils daily. Patient not taking: Reported on 08/06/2021 02/25/17   Lutricia Feil, PA-C  hydrOXYzine (ATARAX/VISTARIL) 25 MG tablet Take 1 tablet (25 mg total) by mouth every 6 (six) hours. Patient not taking: Reported on 08/06/2021 07/22/21   Delton See, MD  predniSONE (STERAPRED UNI-PAK 21 TAB) 10 MG (21) TBPK tablet Take by mouth daily. Take 6 tabs by mouth daily  for 2 days, then 5 tabs for 2 days, then 4 tabs for 2 days, then 3 tabs for  2 days, 2 tabs for 2 days, then 1 tab by mouth daily for 2 days Patient not taking: Reported on 08/06/2021 07/22/21   Delton See, MD     Allergies Patient has no known allergies.  Family History  Problem Relation Age of Onset   Hypertension Mother    Coronary artery disease Father    Hypertension Father     Social History Social History   Tobacco Use   Smoking status: Never   Smokeless tobacco: Never  Vaping Use   Vaping Use: Never used  Substance Use Topics   Alcohol use: Not Currently   Drug use: No    Review of Systems  Constitutional: No fever/chills Eyes: No visual changes.  ENT: No sore throat. Cardiovascular: As above Respiratory: Denies shortness of breath. Gastrointestinal: No abdominal pain.  No nausea, no vomiting.   Genitourinary: Negative for dysuria. Musculoskeletal: Negative for back pain. Skin: Negative for rash. Neurological: Negative for headaches    ____________________________________________   PHYSICAL EXAM:  VITAL SIGNS: ED Triage Vitals  Enc Vitals Group     BP 08/06/21 1242 (!) 84/60     Pulse Rate 08/06/21 1242 64     Resp 08/06/21 1242 20     Temp 08/06/21 1740 97.9 F (36.6 C)     Temp Source 08/06/21 1740 Oral     SpO2 08/06/21 1242 99 %     Weight 08/06/21 1243 136.1 kg (300 lb)     Height 08/06/21 1243 1.905 m (6\' 3" )  Head Circumference --      Peak Flow --      Pain Score 08/06/21 1243 0     Pain Loc --      Pain Edu? --      Excl. in GC? --     Constitutional: Alert and oriented.  Eyes: Conjunctivae are normal.   Nose: No congestion/rhinnorhea. Mouth/Throat: Mucous membranes are moist.    Cardiovascular: Normal rate, regular rhythm. Grossly normal heart sounds.  Good peripheral circulation. Respiratory: Normal respiratory effort.  No retractions. Lungs CTAB. Gastrointestinal: Soft and nontender. No distention.    Musculoskeletal: No lower extremity tenderness nor edema.  Warm and well  perfused Neurologic:  Normal speech and language. No gross focal neurologic deficits are appreciated.  Skin:  Skin is warm, dry and intact. No rash noted. Psychiatric: Mood and affect are normal. Speech and behavior are normal.  ____________________________________________   LABS (all labs ordered are listed, but only abnormal results are displayed)  Labs Reviewed  BASIC METABOLIC PANEL - Abnormal; Notable for the following components:      Result Value   Glucose, Bld 109 (*)    Creatinine, Ser 1.39 (*)    All other components within normal limits  CBC - Abnormal; Notable for the following components:   WBC 11.7 (*)    MCV 77.2 (*)    All other components within normal limits  CBG MONITORING, ED - Abnormal; Notable for the following components:   Glucose-Capillary 120 (*)    All other components within normal limits  SARS CORONAVIRUS 2 (TAT 6-24 HRS)  MAGNESIUM  ANA W/REFLEX  LYME DISEASE SEROLOGY W/REFLEX  C-REACTIVE PROTEIN  SEDIMENTATION RATE  FERRITIN  D-DIMER, QUANTITATIVE  HEPATIC FUNCTION PANEL  HIV ANTIBODY (ROUTINE TESTING W REFLEX)  HEMOGLOBIN A1C  T4, FREE  TSH  LYME DISEASE, WESTERN BLOT  URINALYSIS, ROUTINE W REFLEX MICROSCOPIC  HEPATITIS PANEL, ACUTE  ROCKY MTN SPOTTED FVR ABS PNL(IGG+IGM)  TROPONIN I (HIGH SENSITIVITY)  TROPONIN I (HIGH SENSITIVITY)   ____________________________________________  EKG  ED ECG REPORT I, Jene Every, the attending physician, personally viewed and interpreted this ECG.  Date: 08/06/2021  Rhythm: Sinus bradycardia QRS Axis: normal Intervals: normal ST/T Wave abnormalities: normal Narrative Interpretation: no evidence of acute ischemia  ____________________________________________  RADIOLOGY  Chest x-ray viewed by me, no acute abnormality ____________________________________________   PROCEDURES  Procedure(s) performed: yes  .1-3 Lead EKG Interpretation  Date/Time: 08/06/2021 8:30 PM Performed by:  Jene Every, MD Authorized by: Jene Every, MD     Interpretation: abnormal     ECG rate assessment: normal     Rhythm: sinus rhythm     Ectopy: trigeminy     Conduction: normal     Critical Care performed: No ____________________________________________   INITIAL IMPRESSION / ASSESSMENT AND PLAN / ED COURSE  Pertinent labs & imaging results that were available during my care of the patient were reviewed by me and considered in my medical decision making (see chart for details).   Patient presents with complaints as noted above.  EKG is overall reassuring now, high-sensitivity troponin is normal, doubt ACS.  Noted abnormal findings on the monitor suspicious for trigeminy, question possible arrhythmia as the cause of his symptoms.  Patient still quite weak after events, discussed with Dr. Mariah Milling of cardiology, will keep the patient in the hospital for telemetry    ____________________________________________   FINAL CLINICAL IMPRESSION(S) / ED DIAGNOSES  Final diagnoses:  Syncope and collapse  Note:  This document was prepared using Dragon voice recognition software and may include unintentional dictation errors.    Jene Every, MD 08/06/21 2031

## 2021-08-06 NOTE — ED Triage Notes (Signed)
[place] [facility] [place] out- pt is a diabetic and this happened last Sunday as well- pt extremely diaphoretic and difficult to arouse- pt HR dropped to 37 and then came back up to 60's while in triage room- pt has a hx of irregular HR

## 2021-08-06 NOTE — H&P (Signed)
History and Physical    Antonio Peters:[id number] DOB: March 23, 1991 DOA: 08/06/2021  PCP: Patient, No Pcp Per (Inactive)    Patient coming from:  [facility]   Chief Complaint:  Syncope and collapse.   HPI: Antonio Peters is a 30 y.o. male seen in ed with complaints of passing dizziness diaphoresis shortness of breath.  Patient reports that this is the second time this is happened. In the past 2 weeks rest of the sob and once before.  Chart review shows that patient had diffuse rash that was pruritic for which she seek medical care at urgent care and was started on a steroid taper.  Rash was attributed to an allergic reaction secondary to changes in detergent.  Patient at that time did not have any visual issues or any other symptoms that went out.  Patient currently denies any chest pain shortness of breath vision gait or speech issues, fevers chills flank pain urine or abdominal issues.   Pt has no diagnosed past medical history, no primary care.  Patient is a [employer]. Is a family history significant for heart disease in his father who passed from a massive heart attack.  ED Course:  Vitals:   08/06/21 1700 08/06/21 1740 08/06/21 1837 08/06/21 1900  BP: 137/80 103/60 (!) 119/42 127/90  Pulse: 81 74 89 92  Resp: (!) 21 13 (!) 26 (!) 23  Temp:  97.9 F (36.6 C)    TempSrc:  Oral    SpO2: 98% 100% 97% 96%  Weight:      Height:      In the emergency room patient is alert awake oriented cooperative. Blood work reviewed with wife at bedside shows abnormal kidney function with a creatinine of 1.39, WBC count of 11.7, hemoglobin of 14.3, MCV of 77.2.  No known history of sickle cell. Discussed extensively about current findings possible indications and implications, evaluation for TIA, also for Lyme disease serology patient verbalized understanding and has no questions.   Review of Systems:  Review of Systems  Constitutional:  Positive for diaphoresis.  HENT: Negative.    Eyes:   Positive for blurred vision.       Left eye  Respiratory:  Positive for shortness of breath.   Gastrointestinal:  Positive for nausea.  Skin:  Positive for rash.  Neurological:  Positive for dizziness.  All other systems reviewed and are negative.   Past Medical History:  Diagnosis Date   Diabetes mellitus without complication (HCC)    Irregular heartbeat    Irregular heartbeat    Obese     History reviewed. No pertinent surgical history.   reports that he has never smoked. He has never used smokeless tobacco. He reports that he does not currently use alcohol. He reports that he does not use drugs.  No Known Allergies  Family History  Problem Relation Age of Onset   Hypertension Mother    Coronary artery disease Father    Hypertension Father     Prior to Admission medications   Medication Sig Start Date End Date Taking? Authorizing Provider  fluticasone (FLONASE) 50 MCG/ACT nasal spray Place 2 sprays into both nostrils daily. 02/25/17   Lutricia Feil, PA-C  hydrOXYzine (ATARAX/VISTARIL) 25 MG tablet Take 1 tablet (25 mg total) by mouth every 6 (six) hours. 07/22/21   Delton See, MD  predniSONE (STERAPRED UNI-PAK 21 TAB) 10 MG (21) TBPK tablet Take by mouth daily. Take 6 tabs by mouth daily  for 2 days, then 5 tabs  for 2 days, then 4 tabs for 2 days, then 3 tabs for 2 days, 2 tabs for 2 days, then 1 tab by mouth daily for 2 days 07/22/21   Delton See, MD    Physical Exam: Vitals:   08/06/21 1700 08/06/21 1740 08/06/21 1837 08/06/21 1900  BP: 137/80 103/60 (!) 119/42 127/90  Pulse: 81 74 89 92  Resp: (!) 21 13 (!) 26 (!) 23  Temp:  97.9 F (36.6 C)    TempSrc:  Oral    SpO2: 98% 100% 97% 96%  Weight:      Height:       Physical Exam Vitals reviewed.  Constitutional:      Appearance: He is not ill-appearing.  HENT:     Head: Normocephalic and atraumatic.     Right Ear: External ear normal.     Left Ear: External ear normal.     Nose: Nose normal.      Mouth/Throat:     Mouth: Mucous membranes are moist.  Eyes:     Extraocular Movements: Extraocular movements intact.     Pupils: Pupils are equal, round, and reactive to light.  Cardiovascular:     Rate and Rhythm: Normal rate and regular rhythm.     Pulses: Normal pulses.     Heart sounds: Normal heart sounds.  Pulmonary:     Effort: Pulmonary effort is normal.     Breath sounds: Normal breath sounds.  Abdominal:     General: Bowel sounds are normal. There is no distension.     Palpations: Abdomen is soft.     Tenderness: There is no abdominal tenderness. There is no guarding.  Musculoskeletal:     Right lower leg: No edema.     Left lower leg: No edema.  Skin:    General: Skin is warm.  Neurological:     General: No focal deficit present.     Mental Status: He is alert and oriented to person, place, and time.  Psychiatric:        Mood and Affect: Mood normal.        Behavior: Behavior normal.     Labs on Admission: I have personally reviewed following labs and imaging studies  No results for input(s): CKTOTAL, CKMB, TROPONINI in the last 72 hours. Lab Results  Component Value Date   WBC 11.7 (H) 08/06/2021   HGB 14.3 08/06/2021   HCT 40.9 08/06/2021   MCV 77.2 (L) 08/06/2021   PLT 341 08/06/2021    Recent Labs  Lab 08/06/21 1252  NA 138  K 3.7  CL 108  CO2 23  BUN 19  CREATININE 1.39*  CALCIUM 9.1  GLUCOSE 109*   COVID-19 Labs No results for input(s): DDIMER, FERRITIN, LDH, CRP in the last 72 hours.  Lab Results  Component Value Date   SARSCOV2NAA Not Detected 08/29/2020   SARSCOV2NAA Detected (A) 08/24/2020   SARSCOV2NAA Detected (A) 08/17/2020    Radiological Exams on Admission: DG Chest 2 View  Result Date: 08/06/2021 CLINICAL DATA:  Is chest pain.  Syncope. EXAM: CHEST - 2 VIEW COMPARISON:  September 25, 2016. FINDINGS: Trachea midline. Cardiomediastinal contours and hilar structures are stable. Lungs are clear.  No sign of effusion.  No  pneumothorax. On limited assessment no acute skeletal process. IMPRESSION: No acute cardiopulmonary disease. Electronically Signed   By: Donzetta Kohut M.D.   On: 08/06/2021 13:44    EKG: Independently reviewed.  S.Brady - 52 normal interval and no st t wave  changes.   Echocardiogram: Ordered.    Assessment/Plan Principal Problem:   Syncope and collapse Active Problems:   Dizziness   Blurred vision, left eye   Abnormal renal function test   Elevated serum glucose   Obesity   Rash   Syncope and collapse: Chart reviewed per nurses note states patient is a diabetic patient denies any history of diabetes. Differentials include TIA/CVA/vasovagal/dehydration and hypovolemia/cardiac etiology. Will admit patient for evaluation for continuous cardiac monitoring with telemetry. Will also obtain neuroimaging with echocardiogram. Will start patient on IV fluids for the next 24 hours. We will cycle troponins.  Dizziness/blurred vision of the left eye: We will obtain an MRI of the brain 2D echo and carotid Dopplers.    Abnormal renal function test: Creatinine of 1.3, we do not know if this is acute kidney injury or chronic kidney disease we do not have any creatinines in the past many years. Will monitor creatinine overnight after hydration.   Avoid contrast studies, renally dose all meds.  Elevated serum glucose/obesity: Will obtain an A1c.  Rash: Will obtain ANA, Lyme serology, RPR, sed rate, ferritin. Rash has resolved however patient is currently having abnormal renal function along with blurred vision dizziness syncope and collapse uncertain if there is an underlying vasculitic issue.    DVT prophylaxis:  Heparin    Code Status:  Full code    Family Communication:  Alternate Contact Person   +4 more     Hodge,Bianca (Significant other)  [phone] (Mobile)     Disposition Plan:  Home    Consults called:  None  Admission status: Inpatient.     Gertha Calkin  MD [facility] [phone] How to contact the [facility] Attending or Consulting provider 7A - 7P or covering provider during after hours 7P -7A, for this patient.    Check the care team in [facility] and look for a) attending/consulting [facility] provider listed and b) the [facility] team listed Log into [email or web address] and use [place]'s universal password to access. If you do not have the password, please contact the hospital operator. Locate the [facility] provider you are looking for under [facility] and page to a number that you can be directly reached. If you still have difficulty reaching the provider, please page the [facility] (Director on Call) for the Hospitalists listed on amion for assistance. [email or web address] Password [facility] 08/06/2021, 7:41 PM

## 2021-08-06 NOTE — ED Notes (Signed)
Admitting MD at bedside.

## 2021-08-06 NOTE — ED Notes (Signed)
Pt provided water and apple juice

## 2021-08-06 NOTE — ED Notes (Signed)
Pt updated on poc, report given to holding RN

## 2021-08-07 ENCOUNTER — Inpatient Hospital Stay
Admit: 2021-08-07 | Discharge: 2021-08-07 | Disposition: A | Discharge status: Home / Self Care | Payer: BC Managed Care – PPO | Attending: Internal Medicine | Admitting: Internal Medicine

## 2021-08-07 ENCOUNTER — Inpatient Hospital Stay: Payer: BC Managed Care – PPO

## 2021-08-07 ENCOUNTER — Encounter: Payer: Self-pay | Admitting: Internal Medicine

## 2021-08-07 DIAGNOSIS — R0789 Other chest pain: Secondary | ICD-10-CM | POA: Diagnosis not present

## 2021-08-07 DIAGNOSIS — N179 Acute kidney failure, unspecified: Secondary | ICD-10-CM | POA: Diagnosis not present

## 2021-08-07 DIAGNOSIS — R55 Syncope and collapse: Secondary | ICD-10-CM

## 2021-08-07 DIAGNOSIS — R7989 Other specified abnormal findings of blood chemistry: Secondary | ICD-10-CM

## 2021-08-07 LAB — ECHOCARDIOGRAM COMPLETE
AR max vel: 3.11 cm2
AV Area VTI: 2.49 cm2
AV Area mean vel: 2.91 cm2
AV Mean grad: 1 mmHg
AV Peak grad: 2.5 mmHg
Ao pk vel: 0.79 m/s
Area-P 1/2: 2.88 cm2
Height: 75 in
MV VTI: 1.72 cm2
S' Lateral: 3.7 cm
Weight: 4800 [oz_av]

## 2021-08-07 LAB — SARS CORONAVIRUS 2 (TAT 6-24 HRS): SARS Coronavirus 2: NEGATIVE

## 2021-08-07 LAB — HEPATITIS PANEL, ACUTE
HCV Ab: NONREACTIVE
Hep A IgM: NONREACTIVE
Hep B C IgM: NONREACTIVE
Hepatitis B Surface Ag: NONREACTIVE

## 2021-08-07 LAB — BASIC METABOLIC PANEL WITH GFR
Anion gap: 9 (ref 5–15)
BUN: 17 mg/dL (ref 6–20)
CO2: 24 mmol/L (ref 22–32)
Calcium: 8.9 mg/dL (ref 8.9–10.3)
Chloride: 104 mmol/L (ref 98–111)
Creatinine, Ser: 1.03 mg/dL (ref 0.61–1.24)
GFR, Estimated: >60 mL/min
Glucose, Bld: 95 mg/dL (ref 70–99)
Potassium: 3.8 mmol/L (ref 3.5–5.1)
Sodium: 137 mmol/L (ref 135–145)

## 2021-08-07 LAB — SEDIMENTATION RATE: Sed Rate: 5 mm/hr (ref 0–15)

## 2021-08-07 LAB — C-REACTIVE PROTEIN: CRP: 2.1 mg/dL — ABNORMAL HIGH (ref <1.0)

## 2021-08-07 LAB — CBG MONITORING, ED: Glucose-Capillary: 100 mg/dL — ABNORMAL HIGH (ref 70–99)

## 2021-08-07 LAB — HIV ANTIBODY (ROUTINE TESTING W REFLEX): HIV Screen 4th Generation wRfx: NONREACTIVE

## 2021-08-07 LAB — HEMOGLOBIN A1C
Hgb A1c MFr Bld: 6 % — ABNORMAL HIGH (ref 4.8–5.6)
Mean Plasma Glucose: 125.5 mg/dL

## 2021-08-07 MED ORDER — METHYLPREDNISOLONE SODIUM SUCC 40 MG IJ SOLR
40.0000 mg | Freq: Once | INTRAMUSCULAR | Status: AC
  Administered 2021-08-07: 40 mg via INTRAVENOUS
  Filled 2021-08-07: qty 1

## 2021-08-07 MED ORDER — DIPHENHYDRAMINE HCL 25 MG PO CAPS
25.0000 mg | ORAL_CAPSULE | Freq: Four times a day (QID) | ORAL | Status: DC | PRN
  Administered 2021-08-07: 25 mg via ORAL
  Filled 2021-08-07: qty 1

## 2021-08-07 MED ORDER — IOHEXOL 350 MG/ML SOLN
100.0000 mL | Freq: Once | INTRAVENOUS | Status: AC | PRN
  Administered 2021-08-07: 100 mL via INTRAVENOUS
  Filled 2021-08-07: qty 100

## 2021-08-07 MED ORDER — GADOBUTROL 1 MMOL/ML IV SOLN
10.0000 mL | Freq: Once | INTRAVENOUS | Status: AC | PRN
  Administered 2021-08-07: 10 mL via INTRAVENOUS
  Filled 2021-08-07: qty 10

## 2021-08-07 MED ORDER — PERFLUTREN LIPID MICROSPHERE
1.0000 mL | INTRAVENOUS | Status: AC | PRN
  Administered 2021-08-07: 3 mL via INTRAVENOUS
  Filled 2021-08-07: qty 10

## 2021-08-07 MED ORDER — ASPIRIN EC 81 MG PO TBEC: 81.0000 mg | DELAYED_RELEASE_TABLET | Freq: Every day | ORAL | Status: DC

## 2021-08-07 NOTE — ED Notes (Signed)
Pt ambulatory to the restroom without assistance.  

## 2021-08-07 NOTE — Consult Note (Addendum)
Cardiology Consult    Patient ID: Antonio Peters MRN: [id number], DOB/AGE: 1991/08/31   Admit date: 08/06/2021 Date of Consult: 08/07/2021  Primary Physician: Patient, No Pcp Per (Inactive) Primary Cardiologist: Lorine BearsMuhammad Arida, MD Requesting Provider: R. Wieting, MD  Patient Profile    Antonio Peters is a 30 y.o. male with a history of presyncope, palpitations, irregular heartbeat, and morbid obesity, who is being seen today for the evaluation of pre-syncope at the request of Dr. Renae GlossWieting.  Past Medical History   Past Medical History:  Diagnosis Date   Irregular heartbeat    a. 07/2021 occasional isolated PVCs noted on tele monitoring.   Obese    Palpitations    Pre-syncope     History reviewed. No pertinent surgical history.   Allergies  No Known Allergies  History of Present Illness    30 year old male with a history of obesity, palpitations, and irregular heartbeat.  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  About 5 yrs ago, he experienced palpitations while @ church.  He followed up with a physician and was told that he likely had too much stress in his life.  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  Since, he has done well.  He exercises most days of the week without symptoms or limitations.  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  Last Sunday, while setting up his equipment and running cables prior to the service, he had sudden onset of marked diaphoresis which became associated w/ chest tightness, lightheadedness, and unsteady gait.  He sat and asked someone for H2O, and symptoms passed in about 20 mins.    He had no recurrent symptoms throughout the week and went to the gym on 8/20, and worked out w/o symptoms or limitations.  He notes that he didn't eat or drink anything on Saturday night and skipped breakfast on Sunday morning (8/21).  He went onto church and during the end of the  service, while standing behind the sound board, he had recurrent diaphoresis, lightheadedness, and more severe chest tightness.  This was accompanied by marked blurring of his left eye vision.  He felt that he might lose consciousness and sat down.  He continued to feel presyncopal and he was attended to by multiple church members.  Someone gave him a glucose packet out of concern that he might be hypoglycemic.  After taking this, he immediately started felling better.  EMS was called and he was found to have a BG of 69.    [facility] ED, where initial BP was recorded @ 84/60.  ECG w/o acute ST/T changes.  Labs notable for elevated creatinine of 1.39 (previously normal in 2017).  Presenting glucose was 109.  Hemoglobin A1c was 6.0.  Troponin is normal.  Chest x-ray and carotid ultrasound without any acute findings.  MRI of the brain without any evidence of recent infarct though minor burden of nonspecific gliosis/demyelinization in the cerebral white matter was noted.  Patient has had occasional PVCs on telemetry.  He has not had any recurrent symptoms.  In the setting of elevation of his D-dimer, CTA of the chest was performed this morning, and was negative for PE or other intrathoracic process.  In discussing last week symptoms, he notes that he believes he had not eaten or drunk anything for nearly 24 hours prior to that episode as well.  Following administration of IV  fluids, blood pressures have been stable.  Creatinine down to 1.03 this morning.  Inpatient Medications     heparin  5,000 Units Subcutaneous Q8H   sodium chloride flush  3 mL Intravenous Q12H    Family History    Family History  Problem Relation Age of Onset   Hypertension Mother    Coronary artery disease Father    Hypertension Father    He indicated that his mother is alive. He indicated that his father is deceased.   Social History    Social History   Socioeconomic History   Marital status: Single     Spouse name: Not on file   Number of children: Not on file   Years of education: Not on file   Highest education level: Not on file  Occupational History   Not on file  Tobacco Use   Smoking status: Never   Smokeless tobacco: Never  Vaping Use   Vaping Use: Never used  Substance and Sexual Activity   Alcohol use: Not Currently   Drug use: No   Sexual activity: Not Currently    Partners: Female    Birth control/protection: Condom  Other Topics Concern   Not on file  Social History Narrative   Lives locally.  Exercises most days of the week.  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   Social Determinants of Health   Financial Resource Strain: Not on file  Food Insecurity: Not on file  Transportation Needs: Not on file  Physical Activity: Not on file  Stress: Not on file  Social Connections: Not on file  Intimate Partner Violence: Not on file     Review of Systems    General:  No chills, fever, night sweats or weight changes.  Cardiovascular:  +++ presyncope associated diaphoresis +++ chest pain and tightness which is recent presyncopal episodes.  No dyspnea on exertion, edema, orthopnea, palpitations, paroxysmal nocturnal dyspnea. Dermatological: No rash, lesions/masses Respiratory: No cough, dyspnea Urologic: No hematuria, dysuria Abdominal:   No nausea, vomiting, diarrhea, bright red blood per rectum, melena, or hematemesis Neurologic:  No visual changes, wkns, changes in mental status. All other systems reviewed and are otherwise negative except as noted above.  Physical Exam    Blood pressure 133/75, pulse 90, temperature 98.1 F (36.7 C), temperature source Oral, resp. rate 18, height 6\' 3"  (1.905 m), weight 136.1 kg, SpO2 98 %.  General: Pleasant, NAD Psych: Normal affect. Neuro: Alert and oriented X 3. Moves all extremities spontaneously. HEENT: Normal  Neck: Supple without bruits or JVD. Lungs:  Resp regular and unlabored, CTA. Heart: RRR no s3, s4, or  murmurs. Abdomen: Soft, non-tender, non-distended, BS + x 4.  Extremities: No clubbing, cyanosis or edema. DP/PT2+, Radials 2+ and equal bilaterally.  Labs    Cardiac Enzymes Recent Labs  Lab 08/06/21 1252 08/06/21 1600  TROPONINIHS 5 5      Lab Results  Component Value Date   WBC 11.7 (H) 08/06/2021   HGB 14.3 08/06/2021   HCT 40.9 08/06/2021   MCV 77.2 (L) 08/06/2021   PLT 341 08/06/2021    Recent Labs  Lab 08/06/21 2025 08/07/21 0951  NA  --  137  K  --  3.8  CL  --  104  CO2  --  24  BUN  --  17  CREATININE  --  1.03  CALCIUM  --  8.9  PROT 6.9  --   BILITOT 0.9  --   ALKPHOS 44  --  ALT 31  --   AST 26  --   GLUCOSE  --  95   No results found for: CHOL, HDL, LDLCALC, TRIG Lab Results  Component Value Date   DDIMER 1.29 (H) 08/06/2021     Radiology Studies    DG Chest 2 View  Result Date: 08/06/2021 CLINICAL DATA:  Is chest pain.  Syncope. EXAM: CHEST - 2 VIEW COMPARISON:  September 25, 2016. FINDINGS: Trachea midline. Cardiomediastinal contours and hilar structures are stable. Lungs are clear.  No sign of effusion.  No pneumothorax. On limited assessment no acute skeletal process. IMPRESSION: No acute cardiopulmonary disease. Electronically Signed   By: Donzetta Kohut M.D.   On: 08/06/2021 13:44   CT Angio Chest Pulmonary Embolism (PE) W or WO Contrast  Result Date: 08/07/2021 CLINICAL DATA:  Chest discomfort and palpitations, near syncope, diaphoresis, lightheadedness, weakness, reports history of diabetes mellitus EXAM: CT ANGIOGRAPHY CHEST WITH CONTRAST TECHNIQUE: Multidetector CT imaging of the chest was performed using the standard protocol during bolus administration of intravenous contrast. Multiplanar CT image reconstructions and MIPs were obtained to evaluate the vascular anatomy. CONTRAST:  OMNIPAQUE IOHEXOL 350 MG/ML SOLN IV COMPARISON:  None FINDINGS: Cardiovascular: Aorta normal caliber. Heart unremarkable. No pericardial effusion.  Pulmonary arteries adequately opacified and patent. No evidence of pulmonary embolism. Mediastinum/Nodes: Minimal gynecomastia. Esophagus unremarkable. Base of cervical region normal appearance. No thoracic adenopathy. Lungs/Pleura: Lungs clear. No pulmonary infiltrate, pleural effusion or pneumothorax. Upper Abdomen: Unremarkable Musculoskeletal: Unremarkable Review of the MIP images confirms the above findings. IMPRESSION: No evidence of pulmonary embolism. No acute intrathoracic abnormalities. Electronically Signed   By: Ulyses Southward M.D.   On: 08/07/2021 09:59   MR BRAIN WO CONTRAST  Result Date: 08/06/2021 CLINICAL DATA:  Syncope, recurrent EXAM: MRI HEAD WITHOUT CONTRAST TECHNIQUE: Multiplanar, multiecho pulse sequences of the brain and surrounding structures were obtained without intravenous contrast. COMPARISON:  None. FINDINGS: Brain: There is no acute infarction or intracranial hemorrhage. There is no intracranial mass, mass effect, or edema. There is no hydrocephalus or extra-axial fluid collection. Ventricles and sulci are normal in size and configuration. Patchy foci of T2 hyperintensity in the supratentorial white matter likely reflecting nonspecific gliosis/demyelination. Vascular: Major vessel flow voids at the skull base are preserved. Skull and upper cervical spine: Normal marrow signal is preserved. Sinuses/Orbits: Paranasal sinuses are aerated. Orbits are unremarkable. Other: Sella is unremarkable.  Mastoid air cells are clear. IMPRESSION: No evidence of recent infarction, hemorrhage, or mass. Minor burden of nonspecific gliosis/demyelination in the cerebral white matter. Electronically Signed   By: Guadlupe Spanish M.D.   On: 08/06/2021 20:01   US Carotid Bilateral  Result Date: 08/06/2021 CLINICAL DATA:  Syncope EXAM: BILATERAL CAROTID DUPLEX ULTRASOUND TECHNIQUE: Wallace Cullens scale imaging, color Doppler and duplex ultrasound were performed of bilateral carotid and vertebral arteries in the neck.  COMPARISON:  None. FINDINGS: Criteria: Quantification of carotid stenosis is based on velocity parameters that correlate the residual internal carotid diameter with NASCET-based stenosis levels, using the diameter of the distal internal carotid lumen as the denominator for stenosis measurement. The following velocity measurements were obtained: RIGHT ICA: 112/25 cm/sec CCA: 123/33 cm/sec SYSTOLIC ICA/CCA RATIO:  0.9 ECA: 151 cm/sec LEFT ICA: 157/36 cm/sec CCA: 135/31 cm/sec SYSTOLIC ICA/CCA RATIO:  1.2 ECA: 170 cm/sec RIGHT CAROTID ARTERY: No significant plaque formation demonstrated on grayscale images. Homogeneous normal color flow signal. Normal velocity waveforms. RIGHT VERTEBRAL ARTERY:  Patent with antegrade flow direction. LEFT CAROTID ARTERY: No significant plaque formation demonstrated  on grayscale images. Homogeneous normal color flow signal. Normal velocity waveforms. There is mild elevation of the peak systolic flow velocities in the internal carotid artery but this is comparable to the common carotid artery with normal ratio. LEFT VERTEBRAL ARTERY:  Patent with antegrade flow direction. IMPRESSION: No evidence of hemodynamically significant stenosis of either right or the left internal carotid artery. Electronically Signed   By: Burman Nieves M.D.   On: 08/06/2021 20:45    ECG & Cardiac Imaging    Sinus bradycardia, 52, no acute ST/T changes - personally reviewed.  Assessment & Plan    1.  Presyncope: Patient presented to the [facility] ED on the afternoon of August 21, following his second episode in 2 weeks of presyncope associated with chest tightness and diaphoresis.  Prior to both episodes, he notes that he had not had anything to eat or drink for roughly 16 to 24 hours.  He reports that his blood glucose was checked in the setting of yesterday's episode and was 69 after having received a glucose packet.  He also notes that symptoms improved dramatically following that glucose packet.  On  arrival to the ED yesterday, he was hypotensive with a systolic in the 80s and his creatinine was elevated above prior baseline at 1.39.  ECG was without acute findings and troponins have been normal.  He has been hemodynamically stable following saline infusion and creatinine has improved to 1.03.  CTA of the chest was negative for PE or other intrathoracic process, carotid ultrasounds without any significant disease, chest x-ray normal.  MRI of the brain without any acute findings but minor burden of nonspecific gliosis/demyelinization in the cerebral white matter noted.  Symptoms appear to be most likely secondary to dehydration and hypoglycemia.  We will follow telemetry and consider outpatient Zio monitoring for 2 weeks to ensure that there is no evidence of arrhythmia potentially contributing.  Follow-up echocardiogram, which has been ordered for this morning.  Patient seen by neurology related to findings on MRI.  2.  Chest tightness: With each episode of presyncope over the past 2 weeks, patient has had chest tightness as a component of his constellation of symptoms.  On arrival, ECG was without acute ST or T findings and troponins have been normal.  He does not have any family history of premature CAD and CT of the chest did not show any significant coronary calcium.  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  With normal ECG, an exercise treadmill test should suffice and can be performed as an outpatient if necessary.  3.  Acute kidney injury: Creatinine was elevated at 1.39 on arrival, now down to 1.03 following hydration.  Discussed the importance of adequate hydration.  Signed, Nicolasa Ducking, NP 08/07/2021, 12:28 PM  For questions or updates, please contact   Please consult [email or web address] for contact info under Cardiology/STEMI.

## 2021-08-07 NOTE — Progress Notes (Signed)
Patient ID: Antonio Peters, male   DOB: June 19, 1991, 30 y.o.   MRN: [id number] [facility] PROGRESS NOTE  Antonio Peters DDU:[id number] DOB: 13-May-1991 DOA: 08/06/2021 PCP: Patient, No Pcp Per (Inactive)  HPI/Subjective: Patient states that he came in with a near passing out episode and his sugar was low.  He states he hardly eats and he is lucky if he eats 1 meal a day.  He felt his heart racing.  His blood pressure dropped he was having shaking tremors some nausea dizziness and head spinning.  He was having left eye blurry vision.  He lost feeling in his legs and he could not talk.  He was brought in with near syncopal episode.  He also tells me that he was having chest pain and found to have an elevated D-dimer.  Objective: Vitals:   08/07/21 1133 08/07/21 1343  BP: 133/75 128/74  Pulse: 90 70  Resp: 18 16  Temp: 98.1 F (36.7 C) 98.4 F (36.9 C)  SpO2: 98% 100%   No intake or output data in the 24 hours ending 08/07/21 1613 Filed Weights   08/06/21 1243  Weight: 136.1 kg    ROS: Review of Systems  Constitutional:  Positive for malaise/fatigue.  Respiratory:  Negative for shortness of breath.   Cardiovascular:  Positive for chest pain.  Gastrointestinal:  Negative for abdominal pain, nausea and vomiting.  Neurological:  Positive for tingling.  Exam: Physical Exam HENT:     Head: Normocephalic.     Mouth/Throat:     Pharynx: No oropharyngeal exudate.  Eyes:     General: Lids are normal.     Conjunctiva/sclera: Conjunctivae normal.     Pupils: Pupils are equal, round, and reactive to light.  Cardiovascular:     Rate and Rhythm: Normal rate and regular rhythm.     Heart sounds: Normal heart sounds, S1 normal and S2 normal.  Pulmonary:     Breath sounds: No decreased breath sounds, wheezing, rhonchi or rales.  Abdominal:     Palpations: Abdomen is soft.     Tenderness: There is no abdominal tenderness.  Musculoskeletal:     Right lower leg: No swelling.     Left  lower leg: No swelling.  Skin:    General: Skin is warm.     Comments: When I saw him he did not have a rash this morning.  Neurological:     Mental Status: He is alert and oriented to person, place, and time.     Comments: Power 5 out of 5 bilateral upper and lower extremities      Scheduled Meds:  heparin  5,000 Units Subcutaneous Q8H   sodium chloride flush  3 mL Intravenous Q12H   Continuous Infusions:  sodium chloride Stopped (08/07/21 1043)    Assessment/Plan:  Near syncope with left visual issues and bilateral leg numbness and weakness.  I think poor diet and only eating potentially 1 meal a day likely playing a contributing factor with his low sugar and symptoms.  I advised that he must eat 3 meals a day.  We will check orthostatic vital signs.  Echocardiogram done but still pending.   Left eye visual symptoms, initial MRI showing possibility of demyelinization.  Seen by neurology.  He does not think that this is MS.  Repeat MRI of the brain with contrast does not show any demyelinization.  Neurology will recommend follow-up with ophthalmology as outpatient.  Acute kidney injury on presentation with a creatinine of 1.39.  Creatinine improved  with IV fluids to 1.03. Elevated D-dimer.  CT scan of the chest ordered by me today is negative.  Ultrasound bilateral lower extremities negative. Obesity with a BMI of 37.40 Recurrent rash Benadryl as needed and Solu-Medrol.        Code Status:     Code Status Orders  (From admission, onward)           Start     Ordered   08/06/21 1907  Full code  Continuous        08/06/21 1908           Code Status History     This patient has a current code status but no historical code status.      Disposition Plan: Status is: Inpatient  Dispo: The patient is from: Home              Anticipated d/c is to: Home likely tomorrow              Patient currently being worked up for near syncope and multiple symptoms.   Echocardiogram done but still pending.  Hydration overnight   Difficult to place patient.  No.  Consultants: Cardiology Neurology  Time spent: 28 minutes  Prabhjot [employer]

## 2021-08-07 NOTE — Consult Note (Signed)
Neurology Consultation  Reason for Consult: Dizziness, near syncope, abnormal MRI brain findings Referring Physician: Dr. Loletha Grayer  CC: Dizziness  History is obtained from: Patient, chart  HPI: Antonio Peters is a 30 y.o. male no significant past medical history, presented to the emergency room for complaints of dizziness and transient loss of sensation in both of his legs and difficulty standing. He reports that this is the second time that he has had a sensation where he felt dizzy-that he describes as lightheadedness and nearly passed out with his head is down, eyes closed but he was able to hear people-does not think he lost consciousness.  He seemed very diaphoretic and reported severe chest discomfort at that time.  He has had a similar episode once in the past but that was much less severe.  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 He has been reporting some blurred vision ongoing in his left eye-he had corrective lenses that broke and he never replaced his eyeglasses.  He denies any transient episodes of visual loss, tingling, numbness or weakness.  Denies any electric shocklike sensation going down the spine when he bends his neck. He was evaluated in the emergency room and admitted for further observation.  Brain MRI was obtained as a part of the syncope work-up which revealed nonspecific looking T2 hyperintensities-not very impressive for demyelination but given his young age and myriad of nonspecific neurological symptoms, gliosis versus demyelination was in the differentials for which a neurological consultation was obtained. Denies any history of known hypertension-but with the caveat that he has not had any primary care follow-up.  A couple of times that his blood pressure was taken at doctors appointments, it was high but returned to normal on repeat measurements. Denies history of  migraines and headaches. Denies history of smoking.  Denies history of illicit drug use. Work as a Scientific laboratory technician.  ROS: Full ROS was performed and is negative except as noted in the HPI.   Past Medical History:  Diagnosis Date   Irregular heartbeat    a. 07/2021 occasional isolated PVCs noted on tele monitoring.   Obese    Palpitations    Pre-syncope     Family History  Problem Relation Age of Onset   Hypertension Mother    Coronary artery disease Father    Hypertension Father      Social History:   reports that he has never smoked. He has never used smokeless tobacco. He reports that he does not currently use alcohol. He reports that he does not use drugs.  Medications  Current Facility-Administered Medications:    0.9 %  sodium chloride infusion, , Intravenous, Continuous, Florina Ou V, MD, Last Rate: 75 mL/hr at 08/07/21 0512, Infusion Verify at 08/07/21 0512   heparin injection 5,000 Units, 5,000 Units, Subcutaneous, Q8H, Patel, Ekta V, MD   sodium chloride flush (NS) 0.9 % injection 3 mL, 3 mL, Intravenous, Q12H, Para Skeans, MD, 3 mL at 08/06/21 2105  Current Outpatient Medications:    fluticasone (FLONASE) 50 MCG/ACT nasal spray, Place 2 sprays into both nostrils daily. (Patient not taking: Reported on 08/06/2021), Disp: 16 g, Rfl: 0   hydrOXYzine (ATARAX/VISTARIL) 25 MG tablet, Take 1 tablet (25 mg total) by mouth every 6 (six) hours. (Patient not taking: Reported on 08/06/2021), Disp: 12 tablet, Rfl: 0   predniSONE (STERAPRED UNI-PAK 21 TAB) 10 MG (  21) TBPK tablet, Take by mouth daily. Take 6 tabs by mouth daily  for 2 days, then 5 tabs for 2 days, then 4 tabs for 2 days, then 3 tabs for 2 days, 2 tabs for 2 days, then 1 tab by mouth daily for 2 days (Patient not taking: Reported on 08/06/2021), Disp: 42 tablet, Rfl: 0   Exam: Current vital signs: BP (!) 149/103   Pulse 87   Temp 97.9 F (36.6 C) (Oral)   Resp 15   Ht 6' 3"  (1.905 m)   Wt 136.1 kg    SpO2 97%   BMI 37.50 kg/m  Vital signs in last 24 hours: Temp:  [97.9 F (36.6 C)] 97.9 F (36.6 C) (08/21 1740) Pulse Rate:  [64-92] 87 (08/22 0900) Resp:  [13-26] 15 (08/22 0800) BP: (84-149)/(42-103) 149/103 (08/22 0900) SpO2:  [96 %-100 %] 97 % (08/22 0900) Weight:  [136.1 kg] 136.1 kg (08/21 1243)  GENERAL: Awake, alert in NAD HEENT: - Normocephalic and atraumatic, dry mm, no LN++, no Thyromegally LUNGS - Clear to auscultation bilaterally with no wheezes CV - S1S2 RRR, no m/r/g, equal pulses bilaterally. ABDOMEN - Soft, nontender, nondistended with normoactive BS Ext: Has rashes on the skin for which he is receiving Benadryl Neurological exam Awake alert oriented x3 No dysarthria Aphasia Cranial nerves: Pupils equal round react light, extraocular movements intact, visual fields full, visual acuity is mildly diminished in the left eye-baseline, no evidence of APD, facial sensation intact, face symmetric, tongue and palate midline. Motor exam: 5/5 in all fours Sensation: Intact to touch all over without focality Coordination: No dysmetria DTRs: Mute with downgoing toes bilaterally.  Labs I have reviewed labs in epic and the results pertinent to this consultation are:  CBC    Component Value Date/Time   WBC 11.7 (H) 08/06/2021 1252   RBC 5.30 08/06/2021 1252   HGB 14.3 08/06/2021 1252   HCT 40.9 08/06/2021 1252   PLT 341 08/06/2021 1252   MCV 77.2 (L) 08/06/2021 1252   MCH 27.0 08/06/2021 1252   MCHC 35.0 08/06/2021 1252   RDW 14.7 08/06/2021 1252    CMP     Component Value Date/Time   NA 138 08/06/2021 1252   K 3.7 08/06/2021 1252   CL 108 08/06/2021 1252   CO2 23 08/06/2021 1252   GLUCOSE 109 (H) 08/06/2021 1252   BUN 19 08/06/2021 1252   CREATININE 1.39 (H) 08/06/2021 1252   CALCIUM 9.1 08/06/2021 1252   PROT 6.9 08/06/2021 2025   ALBUMIN 3.6 08/06/2021 2025   AST 26 08/06/2021 2025   ALT 31 08/06/2021 2025   ALKPHOS 44 08/06/2021 2025   BILITOT 0.9  08/06/2021 2025   GFRNONAA >60 08/06/2021 1252   GFRAA >60 09/20/2016 1255    Imaging I have reviewed the images obtained:  MRI examination of the brain-nonspecific T2 hyperintensities scattered in bilateral hemispheres  Carotid Dopplers-no evidence of stenosis in bilateral carotids, anterograde vertebral artery flow bilaterally.  CTA chest PE protocol-no evidence of PE  Assessment:  30 year old with no significant past medical history presented for evaluation of dizziness and near syncopal episode followed by some difficulty standing with due to weakness and numbness in legs. Current examination is reassuring overall. I have reviewed the images of his brain-there is scattered T2 hyper intensities are definitely something that is not normal but they are not very highly suspicious for a demyelinating process, they look more in the realm of nonspecific white matter disease seen with chronic microvascular  changes (commonly seen with longstanding hypertension, migraines, in the setting of smoking etc.)-that said given his young age and multiplicity of nonspecific neurological symptoms does always raise suspicion for demyelinating pathology. I would recommend doing an MRI of the brain with and without contrast to ensure that there is no abnormal enhancement suggestive of demyelination. His eye complaint/specifically blurred vision from the left eye is longstanding and probably requires corrective lenses-I would recommend outpatient ophthalmology follow-up Syncope work-up per primary team  Impression: Recurrent Syncope versus presyncope Abnormal brain MRI-likely suggestive of chronic microvascular changes and low suspicion for demyelination  Recommendations: -MR brain with contrast-we will consider steroids if there is abnormal enhancement but in the absence of such, I do not think we need any further neurological work-up. -Presyncope/syncope work-up including echocardiogram per primary team as  you are -Outpatient ophthalmology follow-up for left eye blurriness. I will follow imaging with you.  Plan d/w Dr. Leslye Peer   -- Amie [place], MD Neurologist [facility] Pager: [phone]

## 2021-08-07 NOTE — Progress Notes (Signed)
Same day progress note  MRI brain with contrast with no abnormal enhancement Low suspicion for this being MS. Syncope work-up per primary team Outpatient follow-up in 8 to 12 weeks if no clear cardiac or medical cause is identified for his syncope. No further inpatient neurological work-up needed Plan communicated to Dr. Renae Gloss  -- Milon Dikes, MD Neurologist [facility] Pager: [phone]

## 2021-08-07 NOTE — Progress Notes (Signed)
*  PRELIMINARY RESULTS* Echocardiogram 2D Echocardiogram has been performed.  Antonio Peters 08/07/2021, 12:46 PM

## 2021-08-08 ENCOUNTER — Other Ambulatory Visit: Payer: Self-pay | Admitting: Physician Assistant

## 2021-08-08 ENCOUNTER — Inpatient Hospital Stay (HOSPITAL_COMMUNITY)
Admit: 2021-08-08 | Discharge: 2021-08-08 | Disposition: A | Discharge status: Home / Self Care | Payer: BC Managed Care – PPO | Attending: Physician Assistant | Admitting: Physician Assistant

## 2021-08-08 DIAGNOSIS — R55 Syncope and collapse: Secondary | ICD-10-CM

## 2021-08-08 DIAGNOSIS — R42 Dizziness and giddiness: Secondary | ICD-10-CM

## 2021-08-08 LAB — ANA W/REFLEX IF POSITIVE: Anti Nuclear Antibody (ANA): NEGATIVE

## 2021-08-08 LAB — BASIC METABOLIC PANEL
Anion gap: 9 (ref 5–15)
BUN: 13 mg/dL (ref 6–20)
CO2: 24 mmol/L (ref 22–32)
Calcium: 9.5 mg/dL (ref 8.9–10.3)
Chloride: 103 mmol/L (ref 98–111)
Creatinine, Ser: 1.03 mg/dL (ref 0.61–1.24)
GFR, Estimated: >60 mL/min (ref >60)
Glucose, Bld: 105 mg/dL — ABNORMAL HIGH (ref 70–99)
Potassium: 3.8 mmol/L (ref 3.5–5.1)
Sodium: 136 mmol/L (ref 135–145)

## 2021-08-08 LAB — ANA W/REFLEX: Anti Nuclear Antibody (ANA): NEGATIVE

## 2021-08-08 LAB — LYME DISEASE SEROLOGY W/REFLEX: Lyme Total Antibody EIA: NEGATIVE

## 2021-08-08 LAB — INSULIN AND C-PEPTIDE, SERUM
C-Peptide: 3.7 ng/mL (ref 1.1–4.4)
Insulin: 24.1 u[IU]/mL (ref 2.6–24.9)

## 2021-08-08 LAB — ROCKY MTN SPOTTED FVR ABS PNL(IGG+IGM)
RMSF IgG: NEGATIVE
RMSF IgM: 0.43 index (ref 0.00–0.89)

## 2021-08-08 LAB — GLUCOSE, CAPILLARY: Glucose-Capillary: 105 mg/dL — ABNORMAL HIGH (ref 70–99)

## 2021-08-08 MED ORDER — PREDNISONE 10 MG PO TABS: ORAL_TABLET | ORAL | 0 refills | Status: DC

## 2021-08-08 MED ORDER — HYDROCORTISONE 1 % EX LOTN
TOPICAL_LOTION | CUTANEOUS | 0 refills | Status: DC
Start: 2021-08-08 — End: 2021-08-25

## 2021-08-08 MED ORDER — LORATADINE 10 MG PO TABS: 10.0000 mg | ORAL_TABLET | Freq: Every day | ORAL | 0 refills | Status: DC

## 2021-08-08 NOTE — Progress Notes (Signed)
Pt. Discharge summary reviewed, pt aware [place] [place] cardiology office will contact them relate to outpatient appointment with ZIO monitor. Work note given. No questions or concerns. PIV removed per order.

## 2021-08-08 NOTE — Progress Notes (Signed)
Zio placed by Pam, respiratory department

## 2021-08-08 NOTE — Discharge Instructions (Signed)
Eat at least 3 meals a day and stay hydrated Change detergent to free and clear brand

## 2021-08-08 NOTE — Progress Notes (Signed)
Order for Zio AT placed

## 2021-08-08 NOTE — Discharge Summary (Signed)
[facility] [place] at [facility]   PATIENT NAME: Antonio Peters    MR#:  [id number]  DATE OF BIRTH:  1991-05-30  DATE OF ADMISSION:  08/06/2021 ADMITTING PHYSICIAN: Gertha Calkin, MD  DATE OF DISCHARGE: 08/08/2021 11:33 AM  PRIMARY CARE PHYSICIAN: Patient, No Pcp Per (Inactive)    ADMISSION DIAGNOSIS:  Syncope and collapse [R55] Elevated d-dimer [R79.89]  DISCHARGE DIAGNOSIS:  Principal Problem:   Syncope and collapse Active Problems:   Dizziness   Blurred vision, left eye   Rash   Abnormal renal function test   Elevated serum glucose   Obesity   AKI (acute kidney injury) (HCC)   Elevated d-dimer   SECONDARY DIAGNOSIS:   Past Medical History:  Diagnosis Date   Irregular heartbeat    a. 07/2021 occasional isolated PVCs noted on tele monitoring.   Obese    Palpitations    Pre-syncope     HOSPITAL COURSE:   Near syncope with chest pain, left eye visual issues, bilateral leg numbness and weakness.  Telemetry monitoring unremarkable echocardiogram normal.  Cardiology team will do a ZIO monitor as outpatient.  I think part of the issue with the patient passing out with the low sugar is him missing meals and not eating properly.  The patient states that he only eats potentially 1 meal a day. Elevated D-dimer.  CT scan of the chest negative for pulmonary embolism.  Lower extremity Doppler negative for DVT. Left eye visual symptoms.  Lower extremity numbness.  Initial MRI showing possibility of demyelinization but repeat MRI with contrast did not show any demyelinization.  This is not MS.  Seen by neurology. Acute kidney injury on presentation with a creatinine of 1.39.  Creatinine improved to 1.03. Obesity with a BMI of 35.44 Recurrent rash.  I told him to change his detergent to free and clear brand.  We will give a prednisone taper.  Hydrocortisone lotion.  Did prescribe Claritin.  For his dry skin and I do recommend either Lubriderm lotion or Keri  lotion.  DISCHARGE CONDITIONS:   Satisfactory  CONSULTS OBTAINED:  Treatment Team:  Antonieta Iba, MD  DRUG ALLERGIES:  No Known Allergies  DISCHARGE MEDICATIONS:   Allergies as of 08/08/2021   No Known Allergies      Medication List     STOP taking these medications    fluticasone 50 MCG/ACT nasal spray Commonly known as: FLONASE   hydrOXYzine 25 MG tablet Commonly known as: ATARAX/VISTARIL   predniSONE 10 MG (21) Tbpk tablet Commonly known as: STERAPRED UNI-PAK 21 TAB Replaced by: predniSONE 10 MG tablet       TAKE these medications    hydrocortisone 1 % lotion Apply twice a day to rash   loratadine 10 MG tablet Commonly known as: Claritin Take 1 tablet (10 mg total) by mouth daily.   predniSONE 10 MG tablet Commonly known as: DELTASONE Two tabs po day1,2; one tab po day3,4, 1/2 tab po day 5,6 Replaces: predniSONE 10 MG (21) Tbpk tablet         DISCHARGE INSTRUCTIONS:   Follow-up with your medical doctor 5 days Follow-up cardiology as outpatient  If you experience worsening of your admission symptoms, develop shortness of breath, life threatening emergency, suicidal or homicidal thoughts you must seek medical attention immediately by calling 911 or calling your MD immediately  if symptoms less severe.  You Must read complete instructions/literature along with all the possible adverse reactions/side effects for all the Medicines you take and  that have been prescribed to you. Take any new Medicines after you have completely understood and accept all the possible adverse reactions/side effects.   Please note  You were cared for by a hospitalist during your hospital stay. If you have any questions about your discharge medications or the care you received while you were in the hospital after you are discharged, you can call the unit and asked to speak with the hospitalist on call if the hospitalist that took care of you is not available. Once you  are discharged, your primary care physician will handle any further medical issues. Please note that NO REFILLS for any discharge medications will be authorized once you are discharged, as it is imperative that you return to your primary care physician (or establish a relationship with a primary care physician if you do not have one) for your aftercare needs so that they can reassess your need for medications and monitor your lab values.    Today   CHIEF COMPLAINT:   Chief Complaint  Patient presents with   Chest Pain    HISTORY OF PRESENT ILLNESS:  Antonio Peters  is a 30 y.o. male came in with multiple complaints including syncope, chest pain, left eye visual symptoms and numbness of his lower extremities   VITAL SIGNS:  Blood pressure 121/72, pulse 76, temperature 98.5 F (36.9 C), resp. rate 17, height 6\' 3"  (1.905 m), weight 128.6 kg, SpO2 100 %.   PHYSICAL EXAMINATION:  GENERAL:  30 y.o.-year-old patient lying in the bed with no acute distress.  EYES: Pupils equal, round, reactive to light and accommodation. No scleral icterus. Extraocular muscles intact.  HEENT: Head atraumatic, normocephalic. Oropharynx and nasopharynx clear.  LUNGS: Normal breath sounds bilaterally, no wheezing, rales,rhonchi or crepitation. No use of accessory muscles of respiration.  CARDIOVASCULAR: S1, S2 normal. No murmurs, rubs, or gallops.  ABDOMEN: Soft, non-tender, non-distended.  EXTREMITIES: No pedal edema.  NEUROLOGIC: Cranial nerves II through XII are intact. Muscle strength 5/5 in all extremities. Sensation intact. Gait not checked.  PSYCHIATRIC: The patient is alert and oriented x 3.  SKIN: Small macular rash on the arms.  Dry skin on his lower legs.  DATA REVIEW:   CBC Recent Labs  Lab 08/06/21 1252  WBC 11.7*  HGB 14.3  HCT 40.9  PLT 341    Chemistries  Recent Labs  Lab 08/06/21 1600 08/06/21 2025 08/07/21 0951 08/08/21 0543  NA  --   --    < > 136  K  --   --    < > 3.8   CL  --   --    < > 103  CO2  --   --    < > 24  GLUCOSE  --   --    < > 105*  BUN  --   --    < > 13  CREATININE  --   --    < > 1.03  CALCIUM  --   --    < > 9.5  MG 2.1  --   --   --   AST  --  26  --   --   ALT  --  31  --   --   ALKPHOS  --  44  --   --   BILITOT  --  0.9  --   --    < > = values in this interval not displayed.     Microbiology Results  Results for  orders placed or performed during the hospital encounter of 08/06/21  SARS CORONAVIRUS 2 (TAT 6-24 HRS) Nasopharyngeal Nasopharyngeal Swab     Status: None   Collection Time: 08/06/21  6:36 PM   Specimen: Nasopharyngeal Swab  Result Value Ref Range Status   SARS Coronavirus 2 NEGATIVE NEGATIVE Final    Comment: (NOTE) SARS-CoV-2 target nucleic acids are NOT DETECTED.  The SARS-CoV-2 RNA is generally detectable in upper and lower respiratory specimens during the acute phase of infection. Negative results do not preclude SARS-CoV-2 infection, do not rule out co-infections with other pathogens, and should not be used as the sole basis for treatment or other patient management decisions. Negative results must be combined with clinical observations, patient history, and epidemiological information. The expected result is Negative.  Fact Sheet for Patients: HairSlick.no  Fact Sheet for Healthcare Providers: quierodirigir.com  This test is not yet approved or cleared by the [place] FDA and  has been authorized for detection and/or diagnosis of SARS-CoV-2 by FDA under an Emergency Use Authorization (EUA). This EUA will remain  in effect (meaning this test can be used) for the duration of the COVID-19 declaration under Se ction 564(b)(1) of the Act, 21 U.S.C. section 360bbb-3(b)(1), unless the authorization is terminated or revoked sooner.  Performed at [facility], [address], [place], [place] [address]     RADIOLOGY:  CT Angio Chest  Pulmonary Embolism (PE) W or WO Contrast  Result Date: 08/07/2021 CLINICAL DATA:  Chest discomfort and palpitations, near syncope, diaphoresis, lightheadedness, weakness, reports history of diabetes mellitus EXAM: CT ANGIOGRAPHY CHEST WITH CONTRAST TECHNIQUE: Multidetector CT imaging of the chest was performed using the standard protocol during bolus administration of intravenous contrast. Multiplanar CT image reconstructions and MIPs were obtained to evaluate the vascular anatomy. CONTRAST:  OMNIPAQUE IOHEXOL 350 MG/ML SOLN IV COMPARISON:  None FINDINGS: Cardiovascular: Aorta normal caliber. Heart unremarkable. No pericardial effusion. Pulmonary arteries adequately opacified and patent. No evidence of pulmonary embolism. Mediastinum/Nodes: Minimal gynecomastia. Esophagus unremarkable. Base of cervical region normal appearance. No thoracic adenopathy. Lungs/Pleura: Lungs clear. No pulmonary infiltrate, pleural effusion or pneumothorax. Upper Abdomen: Unremarkable Musculoskeletal: Unremarkable Review of the MIP images confirms the above findings. IMPRESSION: No evidence of pulmonary embolism. No acute intrathoracic abnormalities. Electronically Signed   By: Ulyses Southward M.D.   On: 08/07/2021 09:59   MR BRAIN WO CONTRAST  Result Date: 08/06/2021 CLINICAL DATA:  Syncope, recurrent EXAM: MRI HEAD WITHOUT CONTRAST TECHNIQUE: Multiplanar, multiecho pulse sequences of the brain and surrounding structures were obtained without intravenous contrast. COMPARISON:  None. FINDINGS: Brain: There is no acute infarction or intracranial hemorrhage. There is no intracranial mass, mass effect, or edema. There is no hydrocephalus or extra-axial fluid collection. Ventricles and sulci are normal in size and configuration. Patchy foci of T2 hyperintensity in the supratentorial white matter likely reflecting nonspecific gliosis/demyelination. Vascular: Major vessel flow voids at the skull base are preserved. Skull and upper  cervical spine: Normal marrow signal is preserved. Sinuses/Orbits: Paranasal sinuses are aerated. Orbits are unremarkable. Other: Sella is unremarkable.  Mastoid air cells are clear. IMPRESSION: No evidence of recent infarction, hemorrhage, or mass. Minor burden of nonspecific gliosis/demyelination in the cerebral white matter. Electronically Signed   By: Guadlupe Spanish M.D.   On: 08/06/2021 20:01   MR BRAIN W CONTRAST  Result Date: 08/07/2021 CLINICAL DATA:  Syncope, recurrent EXAM: MRI HEAD WITH CONTRAST TECHNIQUE: Multiplanar, multiecho pulse sequences of the brain and surrounding structures were obtained with intravenous  contrast. CONTRAST:  10mL GADAVIST GADOBUTROL 1 MMOL/ML IV SOLN COMPARISON:  None. FINDINGS: Postcontrast imaging demonstrates no abnormal enhancement. No new abnormality. IMPRESSION: No abnormal enhancement. Electronically Signed   By: Guadlupe SpanishPraneil  Patel M.D.   On: 08/07/2021 13:38   US Carotid Bilateral  Result Date: 08/06/2021 CLINICAL DATA:  Syncope EXAM: BILATERAL CAROTID DUPLEX ULTRASOUND TECHNIQUE: Wallace CullensGray scale imaging, color Doppler and duplex ultrasound were performed of bilateral carotid and vertebral arteries in the neck. COMPARISON:  None. FINDINGS: Criteria: Quantification of carotid stenosis is based on velocity parameters that correlate the residual internal carotid diameter with NASCET-based stenosis levels, using the diameter of the distal internal carotid lumen as the denominator for stenosis measurement. The following velocity measurements were obtained: RIGHT ICA: 112/25 cm/sec CCA: 123/33 cm/sec SYSTOLIC ICA/CCA RATIO:  0.9 ECA: 151 cm/sec LEFT ICA: 157/36 cm/sec CCA: 135/31 cm/sec SYSTOLIC ICA/CCA RATIO:  1.2 ECA: 170 cm/sec RIGHT CAROTID ARTERY: No significant plaque formation demonstrated on grayscale images. Homogeneous normal color flow signal. Normal velocity waveforms. RIGHT VERTEBRAL ARTERY:  Patent with antegrade flow direction. LEFT CAROTID ARTERY: No significant  plaque formation demonstrated on grayscale images. Homogeneous normal color flow signal. Normal velocity waveforms. There is mild elevation of the peak systolic flow velocities in the internal carotid artery but this is comparable to the common carotid artery with normal ratio. LEFT VERTEBRAL ARTERY:  Patent with antegrade flow direction. IMPRESSION: No evidence of hemodynamically significant stenosis of either right or the left internal carotid artery. Electronically Signed   By: Burman NievesWilliam  Stevens M.D.   On: 08/06/2021 20:45   US Venous Img Lower Bilateral (DVT)  Result Date: 08/07/2021 CLINICAL DATA:  Elevated D-dimer EXAM: BILATERAL LOWER EXTREMITY VENOUS DOPPLER ULTRASOUND TECHNIQUE: Gray-scale sonography with graded compression, as well as color Doppler and duplex ultrasound were performed to evaluate the lower extremity deep venous systems from the level of the common femoral vein and including the common femoral, femoral, profunda femoral, popliteal and calf veins including the posterior tibial, peroneal and gastrocnemius veins when visible. The superficial great saphenous vein was also interrogated. Spectral Doppler was utilized to evaluate flow at rest and with distal augmentation maneuvers in the common femoral, femoral and popliteal veins. COMPARISON:  None. FINDINGS: RIGHT LOWER EXTREMITY Common Femoral Vein: No evidence of thrombus. Normal compressibility, respiratory phasicity and response to augmentation. Saphenofemoral Junction: No evidence of thrombus. Normal compressibility and flow on color Doppler imaging. Profunda Femoral Vein: No evidence of thrombus. Normal compressibility and flow on color Doppler imaging. Femoral Vein: No evidence of thrombus. Normal compressibility, respiratory phasicity and response to augmentation. Popliteal Vein: No evidence of thrombus. Normal compressibility, respiratory phasicity and response to augmentation. Calf Veins: No evidence of thrombus. Normal  compressibility and flow on color Doppler imaging. Superficial Great Saphenous Vein: No evidence of thrombus. Normal compressibility. Venous Reflux:  None. Other Findings:  None. LEFT LOWER EXTREMITY Common Femoral Vein: No evidence of thrombus. Normal compressibility, respiratory phasicity and response to augmentation. Saphenofemoral Junction: No evidence of thrombus. Normal compressibility and flow on color Doppler imaging. Profunda Femoral Vein: No evidence of thrombus. Normal compressibility and flow on color Doppler imaging. Femoral Vein: No evidence of thrombus. Normal compressibility, respiratory phasicity and response to augmentation. Popliteal Vein: No evidence of thrombus. Normal compressibility, respiratory phasicity and response to augmentation. Calf Veins: No evidence of thrombus. Normal compressibility and flow on color Doppler imaging. Superficial Great Saphenous Vein: No evidence of thrombus. Normal compressibility. Venous Reflux:  None. Other Findings:  None. IMPRESSION: No evidence of  deep venous thrombosis in either lower extremity. Electronically Signed   By: Malachy Moan M.D.   On: 08/07/2021 15:38   ECHOCARDIOGRAM COMPLETE  Result Date: 08/07/2021    ECHOCARDIOGRAM REPORT   Patient Name:   NOELL SHULAR Date of Exam: 08/07/2021 Medical Rec #:  [id number]     Height:       75.0 in Accession #:    [id number]    Weight:       300.0 lb Date of Birth:  [birth year]     BSA:          2.607 m Patient Age:    30 years      BP:           133/75 mmHg Patient Gender: M             HR:           65 bpm. Exam Location:  [facility] Procedure: 2D Echo, Color Doppler, Cardiac Doppler and Intracardiac            Opacification Agent Indications:     R55 Syncope  History:         Patient has no prior history of Echocardiogram examinations.                  Arrythmias:Arrhythmia.  Sonographer:     Humphrey Rolls Referring Phys:  [id number] Eliezer Mccoy PATEL Diagnosing Phys: Alwyn Pea MD  Sonographer Comments: Suboptimal  apical window and no subcostal window. Image acquisition challenging due to patient body habitus. IMPRESSIONS  1. Left ventricular ejection fraction, by estimation, is 50 to 55%. The left ventricle has low normal function. The left ventricle has no regional wall motion abnormalities. Left ventricular diastolic parameters were normal.  2. Right ventricular systolic function is normal. The right ventricular size is normal.  3. The mitral valve is normal in structure. Mild mitral valve regurgitation.  4. The aortic valve is normal in structure. Aortic valve regurgitation is not visualized. Mild to moderate aortic valve stenosis. FINDINGS  Left Ventricle: Left ventricular ejection fraction, by estimation, is 50 to 55%. The left ventricle has low normal function. The left ventricle has no regional wall motion abnormalities. Definity contrast agent was given IV to delineate the left ventricular endocardial borders. The left ventricular internal cavity size was normal in size. There is no left ventricular hypertrophy. Left ventricular diastolic parameters were normal. Right Ventricle: The right ventricular size is normal. No increase in right ventricular wall thickness. Right ventricular systolic function is normal. Left Atrium: Left atrial size was normal in size. Right Atrium: Right atrial size was normal in size. Pericardium: There is no evidence of pericardial effusion. Mitral Valve: The mitral valve is normal in structure. Mild mitral valve regurgitation. MV peak gradient, 2.9 mmHg. The mean mitral valve gradient is 1.0 mmHg. Tricuspid Valve: The tricuspid valve is normal in structure. Tricuspid valve regurgitation is mild. Aortic Valve: The aortic valve is normal in structure. Aortic valve regurgitation is not visualized. Mild to moderate aortic stenosis is present. Aortic valve mean gradient measures 1.0 mmHg. Aortic valve peak gradient measures 2.5 mmHg. Aortic valve area, by VTI measures 2.49 cm. Pulmonic Valve:  The pulmonic valve was normal in structure. Pulmonic valve regurgitation is not visualized. Aorta: The ascending aorta was not well visualized. IAS/Shunts: No atrial level shunt detected by color flow Doppler.  LEFT VENTRICLE PLAX 2D LVIDd:         5.15 cm  Diastology LVIDs:  3.70 cm  LV e' medial:    8.49 cm/s LV PW:         1.00 cm  LV E/e' medial:  8.4 LV IVS:        0.90 cm  LV e' lateral:   13.20 cm/s LVOT diam:     2.30 cm  LV E/e' lateral: 5.4 LV SV:         35 LV SV Index:   13 LVOT Area:     4.15 cm  LEFT ATRIUM           Index LA diam:      3.60 cm 1.38 cm/m LA Vol (A4C): 23.0 ml 8.82 ml/m  AORTIC VALVE                   PULMONIC VALVE AV Area (Vmax):    3.11 cm    PV Vmax:       1.08 m/s AV Area (Vmean):   2.91 cm    PV Vmean:      79.200 cm/s AV Area (VTI):     2.49 cm    PV VTI:        0.203 m AV Vmax:           79.30 cm/s  PV Peak grad:  4.7 mmHg AV Vmean:          54.900 cm/s PV Mean grad:  3.0 mmHg AV VTI:            0.140 m AV Peak Grad:      2.5 mmHg AV Mean Grad:      1.0 mmHg LVOT Vmax:         59.40 cm/s LVOT Vmean:        38.500 cm/s LVOT VTI:          0.084 m LVOT/AV VTI ratio: 0.60  AORTA Ao Root diam: 2.80 cm MITRAL VALVE MV Area (PHT): 2.88 cm    SHUNTS MV Area VTI:   1.72 cm    Systemic VTI:  0.08 m MV Peak grad:  2.9 mmHg    Systemic Diam: 2.30 cm MV Mean grad:  1.0 mmHg MV Vmax:       0.85 m/s MV Vmean:      54.0 cm/s MV Decel Time: 263 msec MV E velocity: 71.60 cm/s MV A velocity: 40.30 cm/s MV E/A ratio:  1.78 Dwayne D Callwood MD Electronically signed by Alwyn Pea MD Signature Date/Time: 08/07/2021/5:19:34 PM    Final      Management plans discussed with the patient, and he is in agreement.  CODE STATUS:  Code Status History     Date Active Date Inactive Code Status Order ID Comments User Context   08/06/2021 1908 08/08/2021 1634 Full Code [id number]  Gertha Calkin, MD ED      Questions for Most Recent Historical Code Status (Order [id number])         TOTAL TIME TAKING CARE OF THIS PATIENT: 31 minutes.    Alford Highland M.D on 08/08/2021 at 4:34 PM   [facility]  CC: Primary care physician; Patient, No Pcp Per (Inactive)

## 2021-08-11 DIAGNOSIS — E119 Type 2 diabetes mellitus without complications: Secondary | ICD-10-CM | POA: Diagnosis not present

## 2021-08-14 LAB — LYME DISEASE, WESTERN BLOT
IgG P18 Ab.: ABSENT
IgG P23 Ab.: ABSENT
IgG P28 Ab.: ABSENT
IgG P30 Ab.: ABSENT
IgG P39 Ab.: ABSENT
IgG P41 Ab.: ABSENT
IgG P45 Ab.: ABSENT
IgG P58 Ab.: ABSENT
IgG P66 Ab.: ABSENT
IgG P93 Ab.: ABSENT
IgM P23 Ab.: ABSENT
IgM P39 Ab.: ABSENT
IgM P41 Ab.: ABSENT
Lyme IgG Wb: NEGATIVE
Lyme IgM Wb: NEGATIVE

## 2021-08-25 ENCOUNTER — Other Ambulatory Visit: Payer: Self-pay

## 2021-08-25 ENCOUNTER — Encounter: Payer: Self-pay | Admitting: Family Medicine

## 2021-08-25 ENCOUNTER — Ambulatory Visit: Payer: BC Managed Care – PPO | Admitting: Family Medicine

## 2021-08-25 VITALS — BP 118/76 | HR 88 | Temp 98.3°F | Ht 75.0 in | Wt 299.0 lb

## 2021-08-25 DIAGNOSIS — R7303 Prediabetes: Secondary | ICD-10-CM | POA: Insufficient documentation

## 2021-08-25 DIAGNOSIS — R400 Somnolence: Secondary | ICD-10-CM | POA: Insufficient documentation

## 2021-08-25 DIAGNOSIS — R55 Syncope and collapse: Secondary | ICD-10-CM | POA: Diagnosis not present

## 2021-08-25 DIAGNOSIS — Z13220 Encounter for screening for lipoid disorders: Secondary | ICD-10-CM

## 2021-08-25 DIAGNOSIS — R739 Hyperglycemia, unspecified: Secondary | ICD-10-CM

## 2021-08-25 DIAGNOSIS — R42 Dizziness and giddiness: Secondary | ICD-10-CM | POA: Diagnosis not present

## 2021-08-25 DIAGNOSIS — N179 Acute kidney failure, unspecified: Secondary | ICD-10-CM

## 2021-08-25 DIAGNOSIS — E669 Obesity, unspecified: Secondary | ICD-10-CM

## 2021-08-25 DIAGNOSIS — H538 Other visual disturbances: Secondary | ICD-10-CM | POA: Diagnosis not present

## 2021-08-25 DIAGNOSIS — R944 Abnormal results of kidney function studies: Secondary | ICD-10-CM | POA: Diagnosis not present

## 2021-08-25 NOTE — Assessment & Plan Note (Addendum)
Patient with initial episode of near syncope on 07/30/2021 involving significant diaphoresis, fatigue, hyperventilation, and subjective tachycardia with palpitations.  This resolved with ingestion of crackers as patient not eating that prior to this event.  He was asymptomatic throughout the following week and on 08/06/2021 he experienced similar though more severe symptoms culminating in syncopal episode that was witnessed, EMS arrived and patient Hospital where he was admitted from 08/06/2021 until discharge on 08/08/2021.  While admitted he was seen by cardiology, neurology, ruled out DVT, with primary concern being hypoglycemia due to skipped meals.  He was found to have A1c 6.0 while admitted.  He does have ongoing follow-up with cardiology and recently completed Holter monitor use, has visit with cardiology next week.  Examination today reveals benign cranial nerve testing, cardiopulmonary, and abdominal findings.  I have placed a referral for neurology given his presenting symptomatology, nutrition, and ENT/sleep medicine for evaluation of OSA.

## 2021-08-25 NOTE — Assessment & Plan Note (Signed)
Ongoing issue, given his presenting symptoms restratification to include obstructive sleep apnea evaluation to be coordinated through ENT/sleep medicine.  A referral was placed in this regard today.  We will follow peripherally on this issue.

## 2021-08-25 NOTE — Assessment & Plan Note (Signed)
Patient has seen optometrist, was noted to have diminished vision in left eye, eyeglasses ordered.  Was advised a 7-month follow-up for clinical recheck.  Per patient, no diabetic retinopathy noted during testing and evaluation.

## 2021-08-25 NOTE — Assessment & Plan Note (Signed)
See additional assessment(s) for plan details. 

## 2021-08-25 NOTE — Assessment & Plan Note (Signed)
Noted while inpatient, we will follow this with a repeat CMP, he has increased his regular hydration

## 2021-08-25 NOTE — Patient Instructions (Addendum)
-   Obtain fasting labs with orders provided - Maintain follow-up with cardiology - Referral coordinator will contact you in regards to follow-up with nutritionist, neurologist, and ENT/sleep medicine - Our office will contact you with results and next steps. - Contact her office for any questions between now and then

## 2021-08-25 NOTE — Progress Notes (Signed)
Primary Care / Sports Medicine Office Visit  Patient Information:  Patient ID: Antonio FusiRodney Peters, male DOB: 1991-08-31 Age: 30 y.o. MRN: [id number]   Antonio FusiRodney Peters is a pleasant 30 y.o. male presenting with the following:  Chief Complaint  Patient presents with   New Patient (Initial Visit)   Establish Care    Hospital follow-up from 08/06/21, admitted for syncope and collapse; per patient diabetes associated with syncope; will follow-up with Dr. Shea Evansunn in cardiology next week to review 2 week Holter monitor results    Review of Systems pertinent details above   Patient Active Problem List   Diagnosis Date Noted   Somnolence 08/25/2021   Prediabetes 08/25/2021   AKI (acute kidney injury) (HCC)    Elevated d-dimer    Syncope and collapse 08/06/2021   Dizziness 08/06/2021   Blurred vision, left eye 08/06/2021   Rash 08/06/2021   Abnormal renal function test 08/06/2021   Elevated serum glucose 08/06/2021   Obesity 08/06/2021   Past Medical History:  Diagnosis Date   Irregular heartbeat    a. 07/2021 occasional isolated PVCs noted on tele monitoring.   Obese    Palpitations    Pre-syncope    Outpatient Encounter Medications as of 08/25/2021  Medication Sig   [DISCONTINUED] hydrocortisone 1 % lotion Apply twice a day to rash   [DISCONTINUED] loratadine (CLARITIN) 10 MG tablet Take 1 tablet (10 mg total) by mouth daily.   [DISCONTINUED] predniSONE (DELTASONE) 10 MG tablet Two tabs po day1,2; one tab po day3,4, 1/2 tab po day 5,6   No facility-administered encounter medications on file as of 08/25/2021.   History reviewed. No pertinent surgical history.  Vitals:   08/25/21 1323  BP: 118/76  Pulse: 88  Temp: 98.3 F (36.8 C)  SpO2: 96%   Vitals:   08/25/21 1323  Weight: 299 lb (135.6 kg)  Height: 6\' 3"  (1.905 m)   Body mass index is 37.37 kg/m.  DG Chest 2 View  Result Date: 08/06/2021 CLINICAL DATA:  Is chest pain.  Syncope. EXAM: CHEST - 2 VIEW COMPARISON:   September 25, 2016. FINDINGS: Trachea midline. Cardiomediastinal contours and hilar structures are stable. Lungs are clear.  No sign of effusion.  No pneumothorax. On limited assessment no acute skeletal process. IMPRESSION: No acute cardiopulmonary disease. Electronically Signed   By: Donzetta KohutGeoffrey  Wile M.D.   On: 08/06/2021 13:44   CT Angio Chest Pulmonary Embolism (PE) W or WO Contrast  Result Date: 08/07/2021 CLINICAL DATA:  Chest discomfort and palpitations, near syncope, diaphoresis, lightheadedness, weakness, reports history of diabetes mellitus EXAM: CT ANGIOGRAPHY CHEST WITH CONTRAST TECHNIQUE: Multidetector CT imaging of the chest was performed using the standard protocol during bolus administration of intravenous contrast. Multiplanar CT image reconstructions and MIPs were obtained to evaluate the vascular anatomy. CONTRAST:  100mL OMNIPAQUE IOHEXOL 350 MG/ML SOLN IV COMPARISON:  None FINDINGS: Cardiovascular: Aorta normal caliber. Heart unremarkable. No pericardial effusion. Pulmonary arteries adequately opacified and patent. No evidence of pulmonary embolism. Mediastinum/Nodes: Minimal gynecomastia. Esophagus unremarkable. Base of cervical region normal appearance. No thoracic adenopathy. Lungs/Pleura: Lungs clear. No pulmonary infiltrate, pleural effusion or pneumothorax. Upper Abdomen: Unremarkable Musculoskeletal: Unremarkable Review of the MIP images confirms the above findings. IMPRESSION: No evidence of pulmonary embolism. No acute intrathoracic abnormalities. Electronically Signed   By: Ulyses SouthwardMark  Boles M.D.   On: 08/07/2021 09:59   MR BRAIN WO CONTRAST  Result Date: 08/06/2021 CLINICAL DATA:  Syncope, recurrent EXAM: MRI HEAD WITHOUT CONTRAST TECHNIQUE: Multiplanar, multiecho pulse  sequences of the brain and surrounding structures were obtained without intravenous contrast. COMPARISON:  None. FINDINGS: Brain: There is no acute infarction or intracranial hemorrhage. There is no intracranial mass,  mass effect, or edema. There is no hydrocephalus or extra-axial fluid collection. Ventricles and sulci are normal in size and configuration. Patchy foci of T2 hyperintensity in the supratentorial white matter likely reflecting nonspecific gliosis/demyelination. Vascular: Major vessel flow voids at the skull base are preserved. Skull and upper cervical spine: Normal marrow signal is preserved. Sinuses/Orbits: Paranasal sinuses are aerated. Orbits are unremarkable. Other: Sella is unremarkable.  Mastoid air cells are clear. IMPRESSION: No evidence of recent infarction, hemorrhage, or mass. Minor burden of nonspecific gliosis/demyelination in the cerebral white matter. Electronically Signed   By: Guadlupe Spanish M.D.   On: 08/06/2021 20:01   MR BRAIN W CONTRAST  Result Date: 08/07/2021 CLINICAL DATA:  Syncope, recurrent EXAM: MRI HEAD WITH CONTRAST TECHNIQUE: Multiplanar, multiecho pulse sequences of the brain and surrounding structures were obtained with intravenous contrast. CONTRAST:  50mL GADAVIST GADOBUTROL 1 MMOL/ML IV SOLN COMPARISON:  None. FINDINGS: Postcontrast imaging demonstrates no abnormal enhancement. No new abnormality. IMPRESSION: No abnormal enhancement. Electronically Signed   By: Guadlupe Spanish M.D.   On: 08/07/2021 13:38   US Carotid Bilateral  Result Date: 08/06/2021 CLINICAL DATA:  Syncope EXAM: BILATERAL CAROTID DUPLEX ULTRASOUND TECHNIQUE: Wallace Cullens scale imaging, color Doppler and duplex ultrasound were performed of bilateral carotid and vertebral arteries in the neck. COMPARISON:  None. FINDINGS: Criteria: Quantification of carotid stenosis is based on velocity parameters that correlate the residual internal carotid diameter with NASCET-based stenosis levels, using the diameter of the distal internal carotid lumen as the denominator for stenosis measurement. The following velocity measurements were obtained: RIGHT ICA: 112/25 cm/sec CCA: 123/33 cm/sec SYSTOLIC ICA/CCA RATIO:  0.9 ECA: 151  cm/sec LEFT ICA: 157/36 cm/sec CCA: 135/31 cm/sec SYSTOLIC ICA/CCA RATIO:  1.2 ECA: 170 cm/sec RIGHT CAROTID ARTERY: No significant plaque formation demonstrated on grayscale images. Homogeneous normal color flow signal. Normal velocity waveforms. RIGHT VERTEBRAL ARTERY:  Patent with antegrade flow direction. LEFT CAROTID ARTERY: No significant plaque formation demonstrated on grayscale images. Homogeneous normal color flow signal. Normal velocity waveforms. There is mild elevation of the peak systolic flow velocities in the internal carotid artery but this is comparable to the common carotid artery with normal ratio. LEFT VERTEBRAL ARTERY:  Patent with antegrade flow direction. IMPRESSION: No evidence of hemodynamically significant stenosis of either right or the left internal carotid artery. Electronically Signed   By: Burman Nieves M.D.   On: 08/06/2021 20:45   US Venous Img Lower Bilateral (DVT)  Result Date: 08/07/2021 CLINICAL DATA:  Elevated D-dimer EXAM: BILATERAL LOWER EXTREMITY VENOUS DOPPLER ULTRASOUND TECHNIQUE: Gray-scale sonography with graded compression, as well as color Doppler and duplex ultrasound were performed to evaluate the lower extremity deep venous systems from the level of the common femoral vein and including the common femoral, femoral, profunda femoral, popliteal and calf veins including the posterior tibial, peroneal and gastrocnemius veins when visible. The superficial great saphenous vein was also interrogated. Spectral Doppler was utilized to evaluate flow at rest and with distal augmentation maneuvers in the common femoral, femoral and popliteal veins. COMPARISON:  None. FINDINGS: RIGHT LOWER EXTREMITY Common Femoral Vein: No evidence of thrombus. Normal compressibility, respiratory phasicity and response to augmentation. Saphenofemoral Junction: No evidence of thrombus. Normal compressibility and flow on color Doppler imaging. Profunda Femoral Vein: No evidence of thrombus.  Normal compressibility and flow on color  Doppler imaging. Femoral Vein: No evidence of thrombus. Normal compressibility, respiratory phasicity and response to augmentation. Popliteal Vein: No evidence of thrombus. Normal compressibility, respiratory phasicity and response to augmentation. Calf Veins: No evidence of thrombus. Normal compressibility and flow on color Doppler imaging. Superficial Great Saphenous Vein: No evidence of thrombus. Normal compressibility. Venous Reflux:  None. Other Findings:  None. LEFT LOWER EXTREMITY Common Femoral Vein: No evidence of thrombus. Normal compressibility, respiratory phasicity and response to augmentation. Saphenofemoral Junction: No evidence of thrombus. Normal compressibility and flow on color Doppler imaging. Profunda Femoral Vein: No evidence of thrombus. Normal compressibility and flow on color Doppler imaging. Femoral Vein: No evidence of thrombus. Normal compressibility, respiratory phasicity and response to augmentation. Popliteal Vein: No evidence of thrombus. Normal compressibility, respiratory phasicity and response to augmentation. Calf Veins: No evidence of thrombus. Normal compressibility and flow on color Doppler imaging. Superficial Great Saphenous Vein: No evidence of thrombus. Normal compressibility. Venous Reflux:  None. Other Findings:  None. IMPRESSION: No evidence of deep venous thrombosis in either lower extremity. Electronically Signed   By: Malachy Moan M.D.   On: 08/07/2021 15:38   ECHOCARDIOGRAM COMPLETE  Result Date: 08/07/2021    ECHOCARDIOGRAM REPORT   Patient Name:   Antonio Peters Date of Exam: 08/07/2021 Medical Rec #:  [id number]     Height:       75.0 in Accession #:    [id number]    Weight:       300.0 lb Date of Birth:  Apr 30, 1991     BSA:          2.607 m Patient Age:    30 years      BP:           133/75 mmHg Patient Gender: M             HR:           65 bpm. Exam Location:  [facility] Procedure: 2D Echo, Color Doppler, Cardiac Doppler  and Intracardiac            Opacification Agent Indications:     R55 Syncope  History:         Patient has no prior history of Echocardiogram examinations.                  Arrythmias:Arrhythmia.  Sonographer:     Humphrey Rolls Referring Phys:  [id number] Eliezer Mccoy PATEL Diagnosing Phys: Alwyn Pea MD  Sonographer Comments: Suboptimal apical window and no subcostal window. Image acquisition challenging due to patient body habitus. IMPRESSIONS  1. Left ventricular ejection fraction, by estimation, is 50 to 55%. The left ventricle has low normal function. The left ventricle has no regional wall motion abnormalities. Left ventricular diastolic parameters were normal.  2. Right ventricular systolic function is normal. The right ventricular size is normal.  3. The mitral valve is normal in structure. Mild mitral valve regurgitation.  4. The aortic valve is normal in structure. Aortic valve regurgitation is not visualized. Mild to moderate aortic valve stenosis. FINDINGS  Left Ventricle: Left ventricular ejection fraction, by estimation, is 50 to 55%. The left ventricle has low normal function. The left ventricle has no regional wall motion abnormalities. Definity contrast agent was given IV to delineate the left ventricular endocardial borders. The left ventricular internal cavity size was normal in size. There is no left ventricular hypertrophy. Left ventricular diastolic parameters were normal. Right Ventricle: The right ventricular size is normal. No increase in right ventricular wall  thickness. Right ventricular systolic function is normal. Left Atrium: Left atrial size was normal in size. Right Atrium: Right atrial size was normal in size. Pericardium: There is no evidence of pericardial effusion. Mitral Valve: The mitral valve is normal in structure. Mild mitral valve regurgitation. MV peak gradient, 2.9 mmHg. The mean mitral valve gradient is 1.0 mmHg. Tricuspid Valve: The tricuspid valve is normal in structure.  Tricuspid valve regurgitation is mild. Aortic Valve: The aortic valve is normal in structure. Aortic valve regurgitation is not visualized. Mild to moderate aortic stenosis is present. Aortic valve mean gradient measures 1.0 mmHg. Aortic valve peak gradient measures 2.5 mmHg. Aortic valve area, by VTI measures 2.49 cm. Pulmonic Valve: The pulmonic valve was normal in structure. Pulmonic valve regurgitation is not visualized. Aorta: The ascending aorta was not well visualized. IAS/Shunts: No atrial level shunt detected by color flow Doppler.  LEFT VENTRICLE PLAX 2D LVIDd:         5.15 cm  Diastology LVIDs:         3.70 cm  LV e' medial:    8.49 cm/s LV PW:         1.00 cm  LV E/e' medial:  8.4 LV IVS:        0.90 cm  LV e' lateral:   13.20 cm/s LVOT diam:     2.30 cm  LV E/e' lateral: 5.4 LV SV:         35 LV SV Index:   13 LVOT Area:     4.15 cm  LEFT ATRIUM           Index LA diam:      3.60 cm 1.38 cm/m LA Vol (A4C): 23.0 ml 8.82 ml/m  AORTIC VALVE                   PULMONIC VALVE AV Area (Vmax):    3.11 cm    PV Vmax:       1.08 m/s AV Area (Vmean):   2.91 cm    PV Vmean:      79.200 cm/s AV Area (VTI):     2.49 cm    PV VTI:        0.203 m AV Vmax:           79.30 cm/s  PV Peak grad:  4.7 mmHg AV Vmean:          54.900 cm/s PV Mean grad:  3.0 mmHg AV VTI:            0.140 m AV Peak Grad:      2.5 mmHg AV Mean Grad:      1.0 mmHg LVOT Vmax:         59.40 cm/s LVOT Vmean:        38.500 cm/s LVOT VTI:          0.084 m LVOT/AV VTI ratio: 0.60  AORTA Ao Root diam: 2.80 cm MITRAL VALVE MV Area (PHT): 2.88 cm    SHUNTS MV Area VTI:   1.72 cm    Systemic VTI:  0.08 m MV Peak grad:  2.9 mmHg    Systemic Diam: 2.30 cm MV Mean grad:  1.0 mmHg MV Vmax:       0.85 m/s MV Vmean:      54.0 cm/s MV Decel Time: 263 msec MV E velocity: 71.60 cm/s MV A velocity: 40.30 cm/s MV E/A ratio:  1.78 Dwayne D Callwood MD Electronically signed by Alwyn Pea MD Signature  Date/Time: 08/07/2021/5:19:34 PM    Final       Independent interpretation of notes and tests performed by another provider:   None  Procedures performed:   None  Pertinent History, Exam, Impression, and Recommendations:   AKI (acute kidney injury) (HCC) Noted while inpatient, we will follow this with a repeat CMP, he has increased his regular hydration  Syncope and collapse Patient with initial episode of near syncope on 07/30/2021 involving significant diaphoresis, fatigue, hyperventilation, and subjective tachycardia with palpitations.  This resolved with ingestion of crackers as patient not eating that prior to this event.  He was asymptomatic throughout the following week and on 08/06/2021 he experienced similar though more severe symptoms culminating in syncopal episode that was witnessed, EMS arrived and patient Hospital where he was admitted from 08/06/2021 until discharge on 08/08/2021.  While admitted he was seen by cardiology, neurology, ruled out DVT, with primary concern being hypoglycemia due to skipped meals.  He was found to have A1c 6.0 while admitted.  He does have ongoing follow-up with cardiology and recently completed Holter monitor use, has visit with cardiology next week.  Examination today reveals benign cranial nerve testing, cardiopulmonary, and abdominal findings.  I have placed a referral for neurology given his presenting symptomatology, nutrition, and ENT/sleep medicine for evaluation of OSA.  Dizziness See additional assessment(s) for plan details.  Abnormal renal function test See additional assessment(s) for plan details.  Blurred vision, left eye Patient has seen optometrist, was noted to have diminished vision in left eye, eyeglasses ordered.  Was advised a 58-month follow-up for clinical recheck.  Per patient, no diabetic retinopathy noted during testing and evaluation.  Elevated serum glucose See additional assessment(s) for plan details.  Somnolence Ongoing issue, given his presenting symptoms  restratification to include obstructive sleep apnea evaluation to be coordinated through ENT/sleep medicine.  A referral was placed in this regard today.  We will follow peripherally on this issue.  Prediabetes Etiology for syncope included concern for hypoglycemic episode, A1c 6.0, patient has been skipping meals.  Additional risk factors include BMI 37.37.  I have placed a referral to nutrition, patient is understanding and amenable to this plan, additional restratification labs ordered.  Obesity See additional assessment(s) for plan details.   I provided a total time of 63 minutes including both face-to-face and non-face-to-face time on 08/25/2021 inclusive of time utilized for medical chart review, information gathering, care coordination with staff, and documentation completion.   Orders & Medications No orders of the defined types were placed in this encounter.  Orders Placed This Encounter  Procedures   Urine Microalbumin w/creat. ratio   Comprehensive metabolic panel   Lipid panel   Ambulatory referral to Neurology   Amb ref to Medical Nutrition Therapy-MNT   Ambulatory referral to ENT     Return if symptoms worsen or fail to improve.     Jerrol Banana, MD   Primary Care Sports Medicine [facility]

## 2021-08-25 NOTE — Assessment & Plan Note (Signed)
Etiology for syncope included concern for hypoglycemic episode, A1c 6.0, patient has been skipping meals.  Additional risk factors include BMI 37.37.  I have placed a referral to nutrition, patient is understanding and amenable to this plan, additional restratification labs ordered.

## 2021-08-27 NOTE — Progress Notes (Signed)
Cardiology Office Note    Date:  09/01/2021   ID:  Antonio Peters, DOB [birth year], MRN [id number]  PCP:  Jerrol Banana, MD  Cardiologist:  Lorine Bears, MD  Electrophysiologist:  None   Chief Complaint: Hospital follow-up  History of Present Illness:   Antonio Peters is a 30 y.o. male with history of presyncope, palpitations, irregular heartbeat, and morbid obesity who presents for hospital follow-up after recent admission to [facility] from 8/21 through 8/23 for presyncope.  Prior history of palpitations approximately 5 years ago with outside physician telling him he had too much stress in his life at that time.  Following transition to a less stressful job he noted improvement in symptoms.  More recently, on 07/30/2021, he developed sudden onset of marked diaphoresis with associated chest tightness, lightheadedness, and unsteady gait.  Symptoms lasting approximately 20 minutes.  Following this, on the evening of 8/20 he did not eat or drink anything that evening.  He also skipped breakfast on 8/21.  At the end of his church service, while standing behind the cell board, he had recurrent diaphoresis, lightheadedness, and chest tightness.  This was associated with marked blurring of his left eye.  He was presyncopal.  Someone gave him a glucose packet, following this he immediately started feeling better.  Upon EMS arrival blood glucose was 69.  EKG showed no acute ST-T changes. High-sensitivity troponin negative x2.  Mild AKI it was noted on labs.  MRI of the brain was without evidence of recent infarct, though a minor burden of nonspecific gliosis/demyelination in the cerebral white matter was noted.  Repeat MRI of the brain with contrast did not show demyelination.  He was evaluated by neurology with low suspicion for MS.  Carotid artery ultrasound showed no hemodynamically significant stenosis of the bilateral ICAs.  With elevated D-dimer, he underwent CTA of the chest which was negative for PE or  other intrathoracic process.  Lower extremity ultrasound was negative for DVT bilaterally.  Echo showed a low normal LV systolic function with an EF of 50 to 55%, no regional wall motion abnormalities, normal LV diastolic function parameters, normal RV systolic function and ventricular cavity size, mild mitral regurgitation, and mild to moderate aortic valve stenosis.  Symptoms of recurrent presyncope were felt to be in the setting of volume depletion, hypoglycemia, and a component of a vasovagal etiology.  It was noted the patient typically only ate 1 meal per day, sometimes skipping food and water for 24 hours in duration.  Subsequent outpatient cardiac monitoring demonstrated a predominant rhythm of sinus with an average rate of 90 bpm (range 55 to 179 bpm), rare PACs, atrial couplets, atrial triplets, PVCs, and ventricular couplets were noted.  He comes in doing well from a cardiac perspective.  No further presyncopal episodes.  While wearing his outpatient cardiac monitor he did have 2 episodes of sharp left-sided chest discomfort that were exacerbated with deep inspiration.  He has been chest pain-free since.  No palpitations or dizziness.  No lower extremity swelling he is now coping with his stress better.  He has been referred to sleep medicine and neurology by PCP.  He is working on eating a regular diet to minimize risk of hypoglycemia.   Labs independently reviewed: 07/2021 - potassium 3.8, BUN 13, serum creatinine 1.03, TSH normal, A1c 6.0, albumin 3.6, AST/ALT normal, magnesium 2.1, Hgb 14.3, PLT 341  Past Medical History:  Diagnosis Date   Irregular heartbeat    a. 07/2021 occasional isolated PVCs  noted on tele monitoring.   Obese    Palpitations    Pre-syncope     History reviewed. No pertinent surgical history.  Current Medications: No outpatient medications have been marked as taking for the 09/01/21 encounter (Office Visit) with Sondra Bargesunn, Chais Fehringer M, PA-C.    Allergies:   Patient has  no known allergies.   Social History   Socioeconomic History   Marital status: Single    Spouse name: Not on file   Number of children: 2   Years of education: 14   Highest education level: Associate degree: academic program  Occupational History   Occupation: CounsellorBeverage deliverer  Tobacco Use   Smoking status: Never   Smokeless tobacco: Never  Vaping Use   Vaping Use: Never used  Substance and Sexual Activity   Alcohol use: Yes    Comment: 1 drink every six months   Drug use: Never   Sexual activity: Yes    Partners: Female    Birth control/protection: Condom  Other Topics Concern   Not on file  Social History Narrative   Lives locally.  Exercises most days of the week.  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   Social Determinants of Health   Financial Resource Strain: Not on file  Food Insecurity: Not on file  Transportation Needs: Not on file  Physical Activity: Not on file  Stress: Not on file  Social Connections: Not on file     Family History:  The patient's family history includes Cataracts in his mother and sister; Coronary artery disease in his father; Heart attack in his father; Hypertension in his father, mother, paternal grandfather, and paternal grandmother.  ROS:   Review of Systems  Constitutional:  Negative for chills, diaphoresis, fever, malaise/fatigue and weight loss.  HENT:  Negative for congestion.   Eyes:  Negative for discharge and redness.  Respiratory:  Negative for cough, sputum production, shortness of breath and wheezing.   Cardiovascular:  Positive for chest pain. Negative for palpitations, orthopnea, claudication, leg swelling and PND.       2 episodes of sharp left-sided chest pain associated with deep inspiration while wearing outpatient cardiac monitoring  Gastrointestinal:  Negative for abdominal pain, heartburn, nausea and vomiting.  Musculoskeletal:  Negative for falls and myalgias.  Skin:  Negative for rash.  Neurological:   Negative for dizziness, tingling, tremors, sensory change, speech change, focal weakness, loss of consciousness and weakness.  Endo/Heme/Allergies:  Does not bruise/bleed easily.  Psychiatric/Behavioral:  Negative for substance abuse. The patient is not nervous/anxious.   All other systems reviewed and are negative.   EKGs/Labs/Other Studies Reviewed:    Studies reviewed were summarized above. The additional studies were reviewed today:  Carotid artery ultrasound 08/06/2021: IMPRESSION: No evidence of hemodynamically significant stenosis of either right or the left internal carotid artery. __________  2D echo 08/07/2021: 1. Left ventricular ejection fraction, by estimation, is 50 to 55%. The  left ventricle has low normal function. The left ventricle has no regional  wall motion abnormalities. Left ventricular diastolic parameters were  normal.   2. Right ventricular systolic function is normal. The right ventricular  size is normal.   3. The mitral valve is normal in structure. Mild mitral valve  regurgitation.   4. The aortic valve is normal in structure. Aortic valve regurgitation is  not visualized. Mild to moderate aortic valve stenosis.  __________  Lower extremity venous ultrasound 08/07/2021: IMPRESSION: No evidence of deep venous thrombosis in either lower extremity. __________  Zio patch 07/2021: Patient had a min HR of 55 bpm, max HR of 179 bpm, and avg HR of 90 bpm. Predominant underlying rhythm was Sinus Rhythm. Isolated SVEs were rare (<1.0%), SVE Couplets were rare (<1.0%), and SVE Triplets were rare (<1.0%). Isolated VEs were rare (<1.0%),  VE Couplets were rare (<1.0%), and no VE Triplets were present. Ventricular Bigeminy and Trigeminy were present.     EKG:  EKG is ordered today.  The EKG ordered today demonstrates NSR, 95 bpm, no acute ST-T changes  Recent Labs: 08/06/2021: ALT 31; Hemoglobin 14.3; Magnesium 2.1; Platelets 341; TSH 1.361 08/08/2021: BUN 13;  Creatinine, Ser 1.03; Potassium 3.8; Sodium 136  Recent Lipid Panel No results found for: CHOL, TRIG, HDL, CHOLHDL, VLDL, LDLCALC, LDLDIRECT  PHYSICAL EXAM:    VS:  BP 127/82 (BP Location: Left Arm, Patient Position: Sitting, Cuff Size: Large)   Pulse 95   Ht 6\' 3"  (1.905 m)   Wt 299 lb (135.6 kg)   SpO2 97%   BMI 37.37 kg/m   BMI: Body mass index is 37.37 kg/m.  Physical Exam Vitals reviewed.  Constitutional:      Appearance: He is well-developed.  HENT:     Head: Normocephalic and atraumatic.  Eyes:     General:        Right eye: No discharge.        Left eye: No discharge.  Neck:     Vascular: No JVD.  Cardiovascular:     Rate and Rhythm: Normal rate and regular rhythm.     Pulses:          Posterior tibial pulses are 2+ on the right side and 2+ on the left side.     Heart sounds: S1 normal and S2 normal. Heart sounds not distant. No midsystolic click and no opening snap. Murmur heard.  Harsh midsystolic murmur is present with a grade of 1/6 at the upper right sternal border radiating to the neck.    No friction rub.  Pulmonary:     Effort: Pulmonary effort is normal. No respiratory distress.     Breath sounds: Normal breath sounds. No decreased breath sounds, wheezing or rales.  Chest:     Chest wall: No tenderness.  Abdominal:     General: There is no distension.     Palpations: Abdomen is soft.     Tenderness: There is no abdominal tenderness.  Musculoskeletal:     Cervical back: Normal range of motion.     Right lower leg: No edema.     Left lower leg: No edema.  Skin:    General: Skin is warm and dry.     Nails: There is no clubbing.  Neurological:     Mental Status: He is alert and oriented to person, place, and time.  Psychiatric:        Speech: Speech normal.        Behavior: Behavior normal.        Thought Content: Thought content normal.        Judgment: Judgment normal.    Wt Readings from Last 3 Encounters:  09/01/21 299 lb (135.6 kg)   08/25/21 299 lb (135.6 kg)  08/08/21 283 lb 8.2 oz (128.6 kg)     Orthostatic vital signs:  Lying: 131/81, 99 beats Sitting: 126/73, 101 bpm Standing: 116/76, 103 bpm Standing time 3 minutes: 125/79, 104 bpm  ASSESSMENT & PLAN:   Presyncope: Presumed to be in the setting of hypoglycemia with poor p.o.  intake, dehydration with noted AKI, and vasovagal etiology.  Thus far, cardiac work-up has been reassuring including high-sensitivity troponin, echo, inpatient telemetry, and outpatient cardiac monitoring.  Orthostatics negative in the office today.  Schedule stress test.    Chest pain: Currently chest pain-free.  2 episodes of chest pain while wearing outpatient cardiac monitoring atypical in presentation.  High-sensitivity troponin negative x2.  Schedule treadmill MPI, if he is found to have significant aortic atherosclerosis or coronary artery calcification on CT attenuation corrected images would recommend a goal LDL less than 70.  Await previously ordered labs for further risk stratification including lipid panel.  Palpitations: Outpatient cardiac monitoring demonstrated sinus rhythm with rare PACs and PVCs and noted ventricular bigeminy lasting 7.7-seconds/trigeminy lasting 29.4 seconds.  No significant sustained arrhythmias.  Aortic stenosis: Mild to moderate on echo in 07/2021.  Unlikely contributing factor to his presentation.  Continue to monitor clinically.  AKI: PCP has ordered follow-up labs.  Hyperglycemia: PCP has referred patient to nutrition.  Follow-up as directed.  Somnolence/sleep disordered breathing.  He has been referred to sleep medicine by outside office.  Obesity: Weight loss is encouraged through heart healthy diet and regular exercise.  Disposition: F/u with Dr. Kirke Corin or an APP in approximately 4 weeks.   Medication Adjustments/Labs and Tests Ordered: Current medicines are reviewed at length with the patient today.  Concerns regarding medicines are outlined  above. Medication changes, Labs and Tests ordered today are summarized above and listed in the Patient Instructions accessible in Encounters.   Signed, Eula Listen, PA-C 09/01/2021 4:04 PM     [facility] [address] [place], [place] [address] [phone]

## 2021-09-01 ENCOUNTER — Other Ambulatory Visit: Payer: Self-pay

## 2021-09-01 ENCOUNTER — Encounter: Payer: Self-pay | Admitting: Physician Assistant

## 2021-09-01 ENCOUNTER — Ambulatory Visit: Payer: BC Managed Care – PPO | Admitting: Physician Assistant

## 2021-09-01 VITALS — BP 127/82 | HR 95 | Ht 75.0 in | Wt 299.0 lb

## 2021-09-01 DIAGNOSIS — R739 Hyperglycemia, unspecified: Secondary | ICD-10-CM

## 2021-09-01 DIAGNOSIS — R002 Palpitations: Secondary | ICD-10-CM | POA: Diagnosis not present

## 2021-09-01 DIAGNOSIS — R400 Somnolence: Secondary | ICD-10-CM

## 2021-09-01 DIAGNOSIS — G4730 Sleep apnea, unspecified: Secondary | ICD-10-CM

## 2021-09-01 DIAGNOSIS — R55 Syncope and collapse: Secondary | ICD-10-CM | POA: Diagnosis not present

## 2021-09-01 DIAGNOSIS — E669 Obesity, unspecified: Secondary | ICD-10-CM

## 2021-09-01 DIAGNOSIS — N179 Acute kidney failure, unspecified: Secondary | ICD-10-CM | POA: Diagnosis not present

## 2021-09-01 DIAGNOSIS — R079 Chest pain, unspecified: Secondary | ICD-10-CM | POA: Diagnosis not present

## 2021-09-01 DIAGNOSIS — Z6837 Body mass index (BMI) 37.0-37.9, adult: Secondary | ICD-10-CM

## 2021-09-01 NOTE — Patient Instructions (Signed)
Medication Instructions:   No changes today.  *If you need a refill on your cardiac medications before your next appointment, please call your pharmacy*   Lab Work:  None ordered  Testing/Procedures:  [facility] MYOVIEW  Your caregiver has ordered a Treadmill Stress Test with nuclear imaging. The purpose of this test is to evaluate the blood supply to your heart muscle. This procedure is referred to as a "Non-Invasive Stress Test." This is because other than having an IV started in your vein, nothing is inserted or "invades" your body. Cardiac stress tests are done to find areas of poor blood flow to the heart by determining the extent of coronary artery disease (CAD). Some patients exercise on a treadmill, which naturally increases the blood flow to your heart, while others who are  unable to walk on a treadmill due to physical limitations have a pharmacologic/chemical stress agent called Lexiscan . This medicine will mimic walking on a treadmill by temporarily increasing your coronary blood flow.   Please note: these test may take anywhere between 2-4 hours to complete  PLEASE REPORT TO [facility] MEDICAL MALL ENTRANCE  THE VOLUNTEERS AT THE FIRST DESK WILL DIRECT YOU WHERE TO GO  Date of Procedure:_____________________________________  Arrival Time for Procedure:______________________________  Instructions regarding medication:    PLEASE NOTIFY THE OFFICE AT LEAST 24 HOURS IN ADVANCE IF YOU ARE UNABLE TO KEEP YOUR APPOINTMENT.  [phone] AND  PLEASE NOTIFY NUCLEAR MEDICINE AT [facility] AT LEAST 24 HOURS IN ADVANCE IF YOU ARE UNABLE TO KEEP YOUR APPOINTMENT. [phone]  How to prepare for your Myoview test:  Do not eat or drink after midnight No caffeine for 24 hours prior to test No smoking 24 hours prior to test. Your medication may be taken with water.  If your doctor stopped a medication because of this test, do not take that medication. Please wear a short sleeve shirt. No perfume,  cologne or lotion. Wear comfortable walking shoes.   Follow-Up: At [facility], you and your health needs are our priority.  As part of our continuing mission to provide you with exceptional heart care, we have created designated Provider Care Teams.  These Care Teams include your primary Cardiologist (physician) and Advanced Practice Providers (APPs -  Physician Assistants and Nurse Practitioners) who all work together to provide you with the care you need, when you need it.  We recommend signing up for the patient portal called "MyChart".  Sign up information is provided on this After Visit Summary.  MyChart is used to connect with patients for Virtual Visits (Telemedicine).  Patients are able to view lab/test results, encounter notes, upcoming appointments, etc.  Non-urgent messages can be sent to your provider as well.   To learn more about what you can do with MyChart, go to [email or web address].    Your next appointment:   1 month(s)  The format for your next appointment:   In Person  Provider:   You may see Lorine Bears, MD or one of the following Advanced Practice Providers on your designated Care Team:    Eula Listen, [place]

## 2021-09-22 ENCOUNTER — Other Ambulatory Visit: Payer: Self-pay

## 2021-09-22 ENCOUNTER — Ambulatory Visit
Admission: EM | Admit: 2021-09-22 | Discharge: 2021-09-22 | Disposition: A | Discharge status: Home / Self Care | Payer: Self-pay | Attending: Emergency Medicine | Admitting: Emergency Medicine

## 2021-09-22 DIAGNOSIS — Z202 Contact with and (suspected) exposure to infections with a predominantly sexual mode of transmission: Secondary | ICD-10-CM | POA: Insufficient documentation

## 2021-09-22 NOTE — ED Triage Notes (Signed)
Pt c/o recent unprotected sex with someone who has tested positive to trichomonas. Pt is requesting testing. Pt states she tested positive today, she tested negative for any other STIs. Pt denies any current s/s.

## 2021-09-22 NOTE — ED Provider Notes (Signed)
[facility]    CSN: [id number] Arrival date & time: 09/22/21  1450      History   Chief Complaint Chief Complaint  Patient presents with   Exposure to STD    HPI Antonio Peters is a 30 y.o. male.   HPI  30 year old male here for STI testing.  Patient reports that his girlfriend informed him today that she tested positive for trichomonas.  He reports that she has been complaining of vaginal discomfort and itching for the past couple of days.  They last had intercourse 6 days ago.  He denies any rashes on his genitalia, painful urination, or discharge from his penis.  Past Medical History:  Diagnosis Date   Irregular heartbeat    a. 07/2021 occasional isolated PVCs noted on tele monitoring.   Obese    Palpitations    Pre-syncope     Patient Active Problem List   Diagnosis Date Noted   Somnolence 08/25/2021   Prediabetes 08/25/2021   AKI (acute kidney injury) (HCC)    Elevated d-dimer    Syncope and collapse 08/06/2021   Dizziness 08/06/2021   Blurred vision, left eye 08/06/2021   Rash 08/06/2021   Abnormal renal function test 08/06/2021   Elevated serum glucose 08/06/2021   Obesity 08/06/2021    History reviewed. No pertinent surgical history.     Home Medications    Prior to Admission medications   Not on File    Family History Family History  Problem Relation Age of Onset   Hypertension Mother    Cataracts Mother    Coronary artery disease Father    Hypertension Father    Heart attack Father    Cataracts Sister    Hypertension Paternal Grandmother    Hypertension Paternal Grandfather     Social History Social History   Tobacco Use   Smoking status: Never   Smokeless tobacco: Never  Vaping Use   Vaping Use: Never used  Substance Use Topics   Alcohol use: Yes    Comment: 1 drink every six months   Drug use: Never     Allergies   Patient has no known allergies.   Review of Systems Review of Systems  Genitourinary:   Negative for dysuria, frequency, genital sores, hematuria, penile discharge, penile pain, penile swelling and urgency.  Hematological: Negative.   Psychiatric/Behavioral: Negative.      Physical Exam Triage Vital Signs ED Triage Vitals  Enc Vitals Group     BP 09/22/21 1504 131/80     Pulse Rate 09/22/21 1504 93     Resp 09/22/21 1504 18     Temp 09/22/21 1504 98.3 F (36.8 C)     Temp Source 09/22/21 1504 Oral     SpO2 09/22/21 1504 100 %     Weight 09/22/21 1502 290 lb (131.5 kg)     Height 09/22/21 1502 6\' 3"  (1.905 m)     Head Circumference --      Peak Flow --      Pain Score 09/22/21 1502 0     Pain Loc --      Pain Edu? --      Excl. in GC? --    No data found.  Updated Vital Signs BP 131/80 (BP Location: Left Arm)   Pulse 93   Temp 98.3 F (36.8 C) (Oral)   Resp 18   Ht 6\' 3"  (1.905 m)   Wt 290 lb (131.5 kg)   SpO2 100%   BMI 36.25  kg/m   Visual Acuity Right Eye Distance:   Left Eye Distance:   Bilateral Distance:    Right Eye Near:   Left Eye Near:    Bilateral Near:     Physical Exam Vitals and nursing note reviewed.  Constitutional:      General: He is not in acute distress.    Appearance: Normal appearance. He is not ill-appearing.  HENT:     Head: Normocephalic and atraumatic.  Skin:    General: Skin is warm and dry.     Capillary Refill: Capillary refill takes less than 2 seconds.     Findings: No erythema or rash.  Neurological:     General: No focal deficit present.     Mental Status: He is alert and oriented to person, place, and time.  Psychiatric:        Mood and Affect: Mood normal.        Behavior: Behavior normal.        Thought Content: Thought content normal.        Judgment: Judgment normal.     UC Treatments / Results  Labs (all labs ordered are listed, but only abnormal results are displayed) Labs Reviewed  CHLAMYDIA/NGC RT PCR ([facility] ONLY)            MISC [facility] TEST (SEND OUT)    EKG   Radiology No results  found.  Procedures Procedures (including critical care time)  Medications Ordered in UC Medications - No data to display  Initial Impression / Assessment and Plan / UC Course  I have reviewed the triage vital signs and the nursing notes.  Pertinent labs & imaging results that were available during my care of the patient were reviewed by me and considered in my medical decision making (see chart for details).  Patient is a nontoxic-appearing 30 year old male here requesting testing for trichomonas after his girlfriend tested positive today.  He reports that she tested negative for gonorrhea and chlamydia.  He denies any urinary symptoms, penile discharge, or penile rashes.  His last sexual encounter with his girlfriend was 6 days ago.  She developed her symptoms 2 days ago.  Upon discussing testing options with patient he has opted to also be tested for gonorrhea and chlamydia in addition to the trichomonas.  We will send dirty urine for all 3.  We will hold on empiric therapy at this time since patient is not symptomatic.   Final Clinical Impressions(s) / UC Diagnoses   Final diagnoses:  Exposure to STD     Discharge Instructions      Please abstain from unprotected intercourse until after testing results have come back.  If you test positive for any STIs we will arrange for treatment at that time.  If your tests are negative then there is nothing further to be done.     ED Prescriptions   None    PDMP not reviewed this encounter.   Becky Augusta, NP 09/22/21 1525

## 2021-09-22 NOTE — Discharge Instructions (Addendum)
Please abstain from unprotected intercourse until after testing results have come back.  If you test positive for any STIs we will arrange for treatment at that time.  If your tests are negative then there is nothing further to be done.

## 2021-09-23 LAB — CHLAMYDIA/NGC RT PCR (ARMC ONLY)
Chlamydia Tr: NOT DETECTED
N gonorrhoeae: NOT DETECTED

## 2021-09-27 ENCOUNTER — Telehealth: Payer: Self-pay | Admitting: Emergency Medicine

## 2021-09-27 LAB — MISC LABCORP TEST (SEND OUT): Labcorp test code: 188052

## 2021-09-27 MED ORDER — METRONIDAZOLE 500 MG PO TABS: 500.0000 mg | ORAL_TABLET | Freq: Two times a day (BID) | ORAL | 0 refills | Status: DC

## 2021-09-27 NOTE — Telephone Encounter (Signed)
Patient tested positive for trichomoniasis.  We will treat with metronidazole twice daily for 7 days.  Prescription sent to pharmacy.

## 2021-10-09 ENCOUNTER — Encounter: Admission: RE | Admit: 2021-10-09 | Payer: Self-pay | Source: Ambulatory Visit

## 2021-10-17 NOTE — Progress Notes (Signed)
Cardiology Office Note    Date:  10/18/2021   ID:  Antonio Peters, DOB 06/01/1991, MRN [id number]  PCP:  Montel Culver, MD  Cardiologist:  Kathlyn Sacramento, MD  Electrophysiologist:  None   Chief Complaint: Follow-up  History of Present Illness:   Antonio Peters is a 30 y.o. male with history of presyncope, palpitations, irregular heartbeat, and morbid obesity who presents for follow-up of [facility] MPI.   Prior history of palpitations approximately 5 years ago with outside physician telling him he had too much stress in his life at that time.  Following transition to a less stressful job he noted improvement in symptoms.  More recently, on 07/30/2021, he developed sudden onset of marked diaphoresis with associated chest tightness, lightheadedness, and unsteady gait.  Symptoms lasting approximately 20 minutes.  Following this, on the evening of 08/05/2021, he did not eat or drink anything that evening.  He also skipped breakfast on 8/21.  At the end of his church service, while standing behind the cell board, he had recurrent diaphoresis, lightheadedness, and chest tightness.  This was associated with marked blurring of his left eye.  He was presyncopal.  Someone gave him a glucose packet, following this he immediately started feeling better.  Upon EMS arrival blood glucose was 69.  EKG showed no acute ST-T changes.  High-sensitivity troponin negative x2.  Mild AKI it was noted on labs.  MRI of the brain was without evidence of recent infarct, though a minor burden of nonspecific gliosis/demyelination in the cerebral white matter was noted.  Repeat MRI of the brain with contrast did not show demyelination.  He was evaluated by neurology with low suspicion for MS.  Carotid artery ultrasound showed no hemodynamically significant stenosis of the bilateral ICAs.  With elevated D-dimer, he underwent CTA of the chest which was negative for PE or other intrathoracic process.  Lower extremity ultrasound was  negative for DVT bilaterally.  Echo showed a low normal LV systolic function with an EF of 50 to 55%, no regional wall motion abnormalities, normal LV diastolic function parameters, normal RV systolic function and ventricular cavity size, mild mitral regurgitation, and mild to moderate aortic valve stenosis.  Symptoms of recurrent presyncope were felt to be in the setting of volume depletion, hypoglycemia, and a component of a vasovagal etiology.  It was noted the patient typically only ate 1 meal per day, sometimes skipping food and water for 24 hours in duration.   Subsequent outpatient cardiac monitoring demonstrated a predominant rhythm of sinus with an average rate of 90 bpm (range 55 to 179 bpm), rare PACs, atrial couplets, atrial triplets, PVCs, and ventricular couplets were noted.  He was seen in hospital follow-up in 08/2021 and was doing well from a cardiac perspective.  He had not had any further presyncopal episodes.  He did report 2 episodes of sharp left-sided chest discomfort that were exacerbated by deep inspiration while wearing outpatient cardiac monitoring.  It was noted he had been referred to sleep medicine and neurology by his PCP.  He was working on improving his diet to minimize risk of hypoglycemia.  Given his history and symptoms, it was recommended he undergo Lexiscan MPI which remains pending at this time.  He comes in doing very well from a cardiac perspective.  No further syncopal or presyncopal episodes.  No chest pain, dyspnea, palpitations, dizziness, lower extremity swelling, or orthopnea.  He has significantly improved his diet.  His weight is down 6 pounds today when compared  to his last clinic visit.  He feels very well and does not have any issues or concerns at this time.  Final read on Lexiscan MPI remains pending.     Labs independently reviewed: 07/2021 - potassium 3.8, BUN 13, serum creatinine 1.03, TSH normal, A1c 6.0, albumin 3.6, AST/ALT normal, magnesium 2.1,  Hgb 14.3, PLT 341    Past Medical History:  Diagnosis Date   Irregular heartbeat    a. 07/2021 occasional isolated PVCs noted on tele monitoring.   Obese    Palpitations    Pre-syncope     History reviewed. No pertinent surgical history.  Current Medications: No outpatient medications have been marked as taking for the 10/18/21 encounter (Office Visit) with Rise Mu, PA-C.    Allergies:   Patient has no known allergies.   Social History   Socioeconomic History   Marital status: Single    Spouse name: Not on file   Number of children: 2   Years of education: 14   Highest education level: Associate degree: academic program  Occupational History   Occupation: Chartered loss adjuster  Tobacco Use   Smoking status: Never   Smokeless tobacco: Never  Vaping Use   Vaping Use: Never used  Substance and Sexual Activity   Alcohol use: Yes    Comment: 1 drink every six months   Drug use: Never   Sexual activity: Yes    Partners: Female    Birth control/protection: Condom  Other Topics Concern   Not on file  Social History Narrative   Lives locally.  Exercises most days of the week.  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   Social Determinants of Health   Financial Resource Strain: Not on file  Food Insecurity: Not on file  Transportation Needs: Not on file  Physical Activity: Not on file  Stress: Not on file  Social Connections: Not on file     Family History:  The patient's family history includes Cataracts in his mother and sister; Coronary artery disease in his father; Heart attack in his father; Hypertension in his father, mother, paternal grandfather, and paternal grandmother.  ROS:   Full-12 point review of systems is negative unless otherwise noted in the HPI.   EKGs/Labs/Other Studies Reviewed:    Studies reviewed were summarized above. The additional studies were reviewed today:  Carotid artery ultrasound 08/06/2021: IMPRESSION: No evidence of  hemodynamically significant stenosis of either right or the left internal carotid artery. __________   2D echo 08/07/2021: 1. Left ventricular ejection fraction, by estimation, is 50 to 55%. The  left ventricle has low normal function. The left ventricle has no regional  wall motion abnormalities. Left ventricular diastolic parameters were  normal.   2. Right ventricular systolic function is normal. The right ventricular  size is normal.   3. The mitral valve is normal in structure. Mild mitral valve  regurgitation.   4. The aortic valve is normal in structure. Aortic valve regurgitation is  not visualized. Mild to moderate aortic valve stenosis.  __________   Lower extremity venous ultrasound 08/07/2021: IMPRESSION: No evidence of deep venous thrombosis in either lower extremity. __________   Elwyn Reach patch 07/2021: Patient had a min HR of 55 bpm, max HR of 179 bpm, and avg HR of 90 bpm. Predominant underlying rhythm was Sinus Rhythm. Isolated SVEs were rare (<1.0%), SVE Couplets were rare (<1.0%), and SVE Triplets were rare (<1.0%). Isolated VEs were rare (<1.0%),  VE Couplets were rare (<1.0%), and no VE  Triplets were present. Ventricular Bigeminy and Trigeminy were present.     EKG:  EKG is not ordered today given Lexiscan MPI performed earlier today.    Recent Labs: 08/06/2021: ALT 31; Hemoglobin 14.3; Magnesium 2.1; Platelets 341; TSH 1.361 08/08/2021: BUN 13; Creatinine, Ser 1.03; Potassium 3.8; Sodium 136  Recent Lipid Panel No results found for: CHOL, TRIG, HDL, CHOLHDL, VLDL, LDLCALC, LDLDIRECT  PHYSICAL EXAM:    VS:  BP 110/74 (BP Location: Left Arm, Patient Position: Sitting, Cuff Size: Large)   Pulse 78   Ht 6' 2.5" (1.892 m)   Wt 293 lb (132.9 kg)   SpO2 98%   BMI 37.12 kg/m   BMI: Body mass index is 37.12 kg/m.  Physical Exam Vitals reviewed.  Constitutional:      Appearance: He is well-developed.  HENT:     Head: Normocephalic and atraumatic.  Eyes:      General:        Right eye: No discharge.        Left eye: No discharge.  Neck:     Vascular: No JVD.  Cardiovascular:     Rate and Rhythm: Normal rate and regular rhythm.     Pulses:          Posterior tibial pulses are 2+ on the right side and 2+ on the left side.     Heart sounds: S1 normal and S2 normal. Heart sounds not distant. No midsystolic click and no opening snap. Murmur heard.  Harsh midsystolic murmur is present with a grade of 1/6 at the upper right sternal border radiating to the neck.    No friction rub.  Pulmonary:     Effort: Pulmonary effort is normal. No respiratory distress.     Breath sounds: Normal breath sounds. No decreased breath sounds, wheezing or rales.  Chest:     Chest wall: No tenderness.  Abdominal:     General: There is no distension.     Palpations: Abdomen is soft.     Tenderness: There is no abdominal tenderness.  Musculoskeletal:     Cervical back: Normal range of motion.     Right lower leg: No edema.     Left lower leg: No edema.  Skin:    General: Skin is warm and dry.     Nails: There is no clubbing.  Neurological:     Mental Status: He is alert and oriented to person, place, and time.  Psychiatric:        Speech: Speech normal.        Behavior: Behavior normal.        Thought Content: Thought content normal.        Judgment: Judgment normal.    Wt Readings from Last 3 Encounters:  10/18/21 293 lb (132.9 kg)  09/22/21 290 lb (131.5 kg)  09/01/21 299 lb (135.6 kg)     ASSESSMENT & PLAN:   Presyncope: No further episodes.  His episode of presyncope has been felt to be in the context of hypoglycemia with poor p.o. intake, dehydration with noted AKI, and vasovagal etiology.  Cardiac work-up including high-sensitivity troponin, echo, inpatient telemetry, outpatient cardiac monitoring, orthostatics, and preliminary read of Lexiscan MPI have all been reassuring.  No plans for further cardiac testing at this time.  Aortic stenosis: Mild  to moderate on echo in 07/2021.  Plan for repeat echo in 07/2022.  AKI: Resolved.  Hyperglycemia: He has been referred to nutrition by his PCP.  Follow-up as directed.  Schedule  future fasting lipid panel and LFT for further risk stratification.  Somnolence/sleep disordered breathing: He has previously been referred to sleep medicine by his PCP.  Obesity: Weight loss is encouraged through healthy diet and regular exercise.  Disposition: F/u with Dr. Fletcher Anon or an APP in 6 months.   Medication Adjustments/Labs and Tests Ordered: Current medicines are reviewed at length with the patient today.  Concerns regarding medicines are outlined above. Medication changes, Labs and Tests ordered today are summarized above and listed in the Patient Instructions accessible in Encounters.   Signed, Christell Faith, PA-C 10/18/2021 4:23 PM     [facility] [address] [place] [address] [place], [place] [address] [phone]

## 2021-10-18 ENCOUNTER — Encounter
Admission: RE | Admit: 2021-10-18 | Discharge: 2021-10-18 | Disposition: A | Discharge status: Home / Self Care | Payer: Self-pay | Source: Ambulatory Visit | Attending: Physician Assistant | Admitting: Physician Assistant

## 2021-10-18 ENCOUNTER — Ambulatory Visit: Payer: BC Managed Care – PPO | Admitting: Physician Assistant

## 2021-10-18 ENCOUNTER — Other Ambulatory Visit: Payer: Self-pay

## 2021-10-18 ENCOUNTER — Encounter: Payer: Self-pay | Admitting: Physician Assistant

## 2021-10-18 VITALS — BP 110/74 | HR 78 | Ht 74.5 in | Wt 293.0 lb

## 2021-10-18 DIAGNOSIS — I350 Nonrheumatic aortic (valve) stenosis: Secondary | ICD-10-CM

## 2021-10-18 DIAGNOSIS — N179 Acute kidney failure, unspecified: Secondary | ICD-10-CM

## 2021-10-18 DIAGNOSIS — R739 Hyperglycemia, unspecified: Secondary | ICD-10-CM

## 2021-10-18 DIAGNOSIS — R55 Syncope and collapse: Secondary | ICD-10-CM

## 2021-10-18 DIAGNOSIS — R079 Chest pain, unspecified: Secondary | ICD-10-CM | POA: Insufficient documentation

## 2021-10-18 DIAGNOSIS — G4730 Sleep apnea, unspecified: Secondary | ICD-10-CM

## 2021-10-18 DIAGNOSIS — Z6837 Body mass index (BMI) 37.0-37.9, adult: Secondary | ICD-10-CM

## 2021-10-18 DIAGNOSIS — E669 Obesity, unspecified: Secondary | ICD-10-CM

## 2021-10-18 DIAGNOSIS — R400 Somnolence: Secondary | ICD-10-CM

## 2021-10-18 LAB — NM MYOCAR MULTI W/SPECT W/WALL MOTION / EF
LV dias vol: 125 mL (ref 62–150)
LV sys vol: 51 mL
Nuc Stress EF: 59 %
Peak HR: 173 {beats}/min
Rest HR: 60 {beats}/min
Rest Nuclear Isotope Dose: 10.9 mCi
SDS: 0
SRS: 0
SSS: 0
ST Depression (mm): 0 mm
Stress Nuclear Isotope Dose: 31.7 mCi
TID: 0.89

## 2021-10-18 MED ORDER — TECHNETIUM TC 99M TETROFOSMIN IV KIT
10.0000 | PACK | Freq: Once | INTRAVENOUS | Status: AC | PRN
  Administered 2021-10-18: 10.89 via INTRAVENOUS

## 2021-10-18 MED ORDER — TECHNETIUM TC 99M TETROFOSMIN IV KIT
30.0000 | PACK | Freq: Once | INTRAVENOUS | Status: AC
  Administered 2021-10-18: 31.65 via INTRAVENOUS

## 2021-10-18 NOTE — Patient Instructions (Addendum)
Medication Instructions:  No changes at this time.  *If you need a refill on your cardiac medications before your next appointment, please call your pharmacy*   Lab Work: Lipid and liver function to be done here in our office. Make sure not to eat or drink anything after midnight except sip of water with medications.   If you have labs (blood work) drawn today and your tests are completely normal, you will receive your results only by: MyChart Message (if you have MyChart) OR A paper copy in the mail If you have any lab test that is abnormal or we need to change your treatment, we will call you to review the results.   Testing/Procedures: None   Follow-Up: At [facility], you and your health needs are our priority.  As part of our continuing mission to provide you with exceptional heart care, we have created designated Provider Care Teams.  These Care Teams include your primary Cardiologist (physician) and Advanced Practice Providers (APPs -  Physician Assistants and Nurse Practitioners) who all work together to provide you with the care you need, when you need it.  Your next appointment:   6 month(s)  The format for your next appointment:   In Person  Provider:   Lorine Bears, MD or Eula Listen, PA-C

## 2021-10-19 ENCOUNTER — Other Ambulatory Visit: Payer: Self-pay | Admitting: *Deleted

## 2021-10-19 DIAGNOSIS — I350 Nonrheumatic aortic (valve) stenosis: Secondary | ICD-10-CM

## 2021-10-23 ENCOUNTER — Other Ambulatory Visit: Payer: Self-pay

## 2021-10-23 ENCOUNTER — Other Ambulatory Visit (INDEPENDENT_AMBULATORY_CARE_PROVIDER_SITE_OTHER): Payer: Self-pay

## 2021-10-23 DIAGNOSIS — R55 Syncope and collapse: Secondary | ICD-10-CM

## 2021-10-23 DIAGNOSIS — R739 Hyperglycemia, unspecified: Secondary | ICD-10-CM

## 2021-10-23 DIAGNOSIS — E669 Obesity, unspecified: Secondary | ICD-10-CM

## 2021-10-23 DIAGNOSIS — I350 Nonrheumatic aortic (valve) stenosis: Secondary | ICD-10-CM

## 2021-10-23 DIAGNOSIS — Z6837 Body mass index (BMI) 37.0-37.9, adult: Secondary | ICD-10-CM

## 2021-10-23 DIAGNOSIS — N179 Acute kidney failure, unspecified: Secondary | ICD-10-CM

## 2021-10-23 DIAGNOSIS — G4730 Sleep apnea, unspecified: Secondary | ICD-10-CM

## 2021-10-23 DIAGNOSIS — R400 Somnolence: Secondary | ICD-10-CM

## 2021-10-24 ENCOUNTER — Telehealth: Payer: Self-pay | Admitting: *Deleted

## 2021-10-24 LAB — LIPID PANEL
Chol/HDL Ratio: 3.9 ratio (ref 0.0–5.0)
Cholesterol, Total: 188 mg/dL (ref 100–199)
HDL: 48 mg/dL (ref >39)
LDL Chol Calc (NIH): 128 mg/dL — ABNORMAL HIGH (ref 0–99)
Triglycerides: 66 mg/dL (ref 0–149)
VLDL Cholesterol Cal: 12 mg/dL (ref 5–40)

## 2021-10-24 LAB — HEPATIC FUNCTION PANEL
ALT: 23 IU/L (ref 0–44)
AST: 26 IU/L (ref 0–40)
Albumin: 4.5 g/dL (ref 4.0–5.0)
Alkaline Phosphatase: 58 IU/L (ref 44–121)
Bilirubin Total: 0.4 mg/dL (ref 0.0–1.2)
Bilirubin, Direct: 0.13 mg/dL (ref 0.00–0.40)
Total Protein: 7.2 g/dL (ref 6.0–8.5)

## 2021-10-24 NOTE — Telephone Encounter (Signed)
-----   Message from Sondra Barges, PA-C sent at 10/24/2021 11:08 AM EST ----- Liver function normal LDL is mildly elevated, though with normal coronary arteries noted on imaging, would recommend continued lifestyle improvement and heart healthy diet.

## 2021-10-24 NOTE — Telephone Encounter (Signed)
Left voicemail message to call back for review of results.  

## 2021-10-26 NOTE — Telephone Encounter (Signed)
Left voicemail message to call back for review of results.  

## 2021-10-27 ENCOUNTER — Encounter: Payer: Self-pay | Admitting: *Deleted

## 2021-10-27 NOTE — Telephone Encounter (Signed)
Letter with results mailed to patient with instructions to call if any questions.

## 2021-10-27 NOTE — Telephone Encounter (Signed)
Left voicemail message to call back for review of results.  

## 2022-03-14 IMAGING — CT CT ANGIO CHEST
2 of 6 series · 18 of 46 positions shown · IV contrast (APPLIED)
Comparison: None

CLINICAL DATA: Chest discomfort and palpitations, near syncope,
diaphoresis, lightheadedness, weakness, reports history of diabetes
mellitus

EXAM:
CT ANGIOGRAPHY CHEST WITH CONTRAST
TECHNIQUE: Multidetector CT imaging of the chest was performed using the
standard protocol during bolus administration of intravenous
contrast. Multiplanar CT image reconstructions and MIPs were
obtained to evaluate the vascular anatomy.
CONTRAST:  100mL OMNIPAQUE IOHEXOL 350 MG/ML SOLN IV

[Series 5: thins · axial · 0.98mm/px · z∈[-587,-327]mm · 15 of 355 slices shown]
[im 15/355  lung]
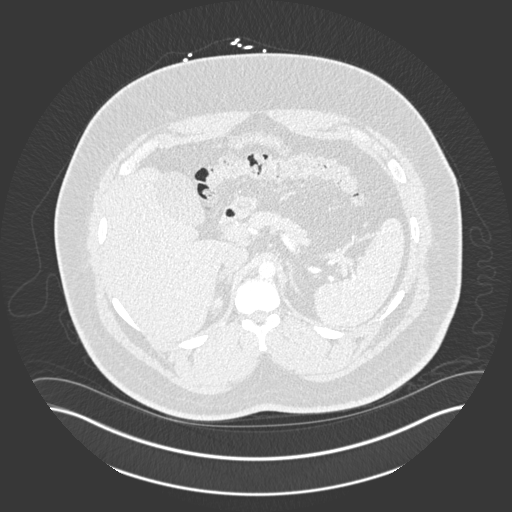
[im 45/355  soft-tissue]
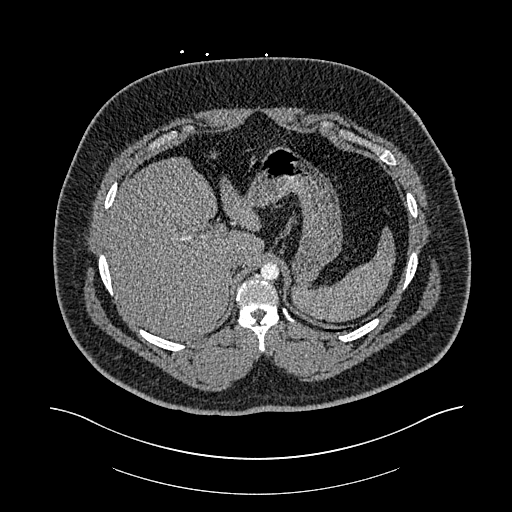
[im 60/355  lung]
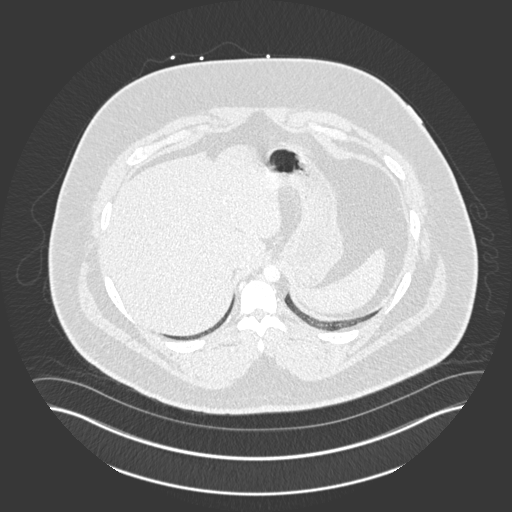
[im 89/355  soft-tissue]
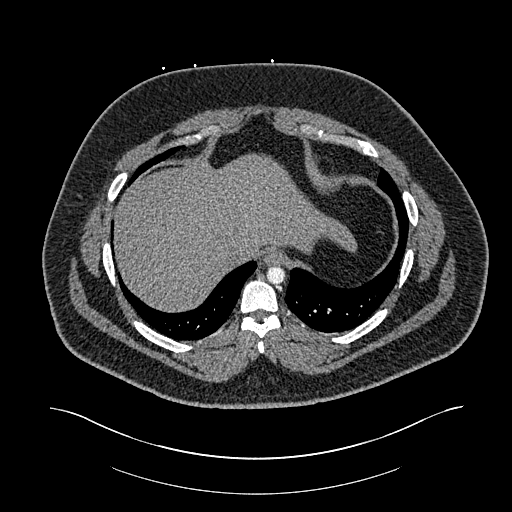
[im 104/355  lung]
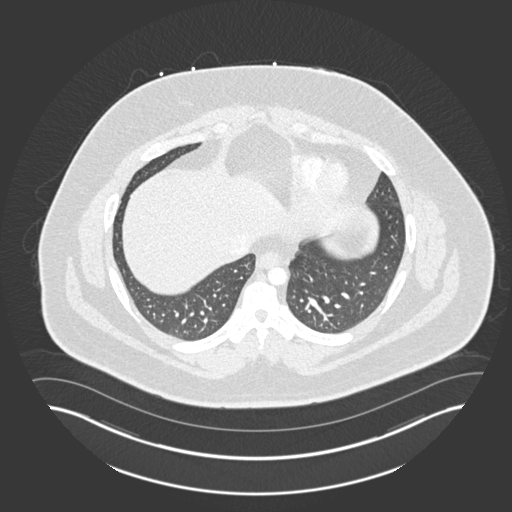
[im 133/355  soft-tissue]
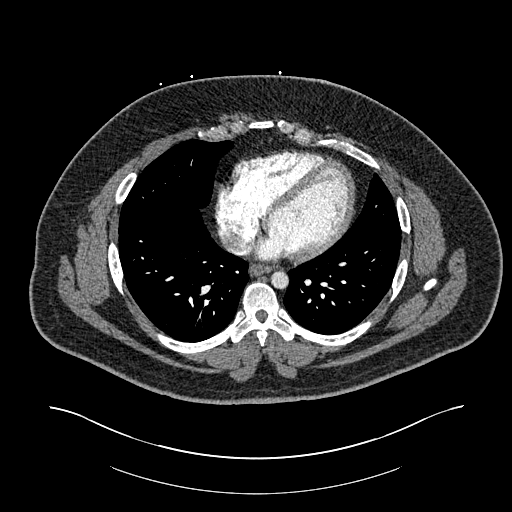
[im 148/355  lung]
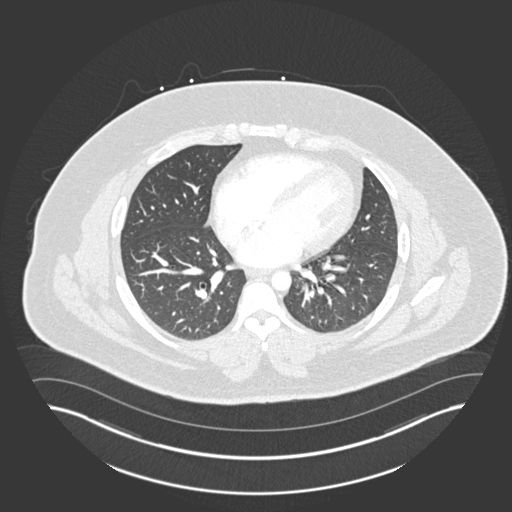
[im 178/355  soft-tissue]
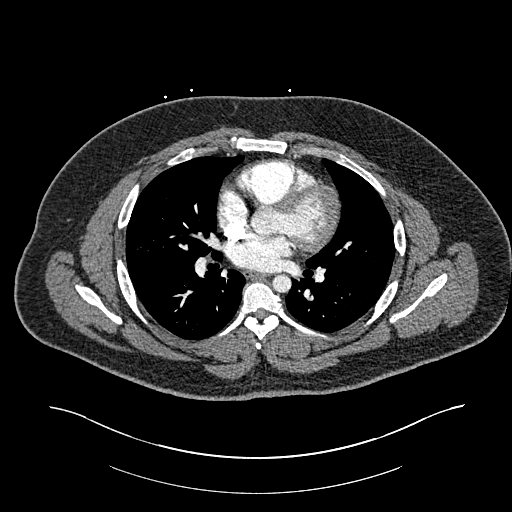
[im 207/355  lung]
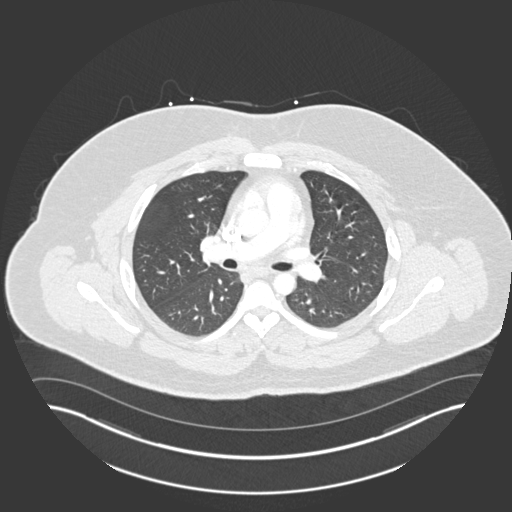
[im 222/355  soft-tissue]
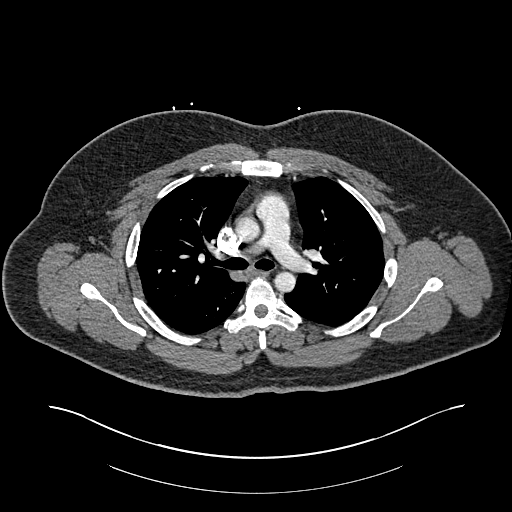
[im 251/355  lung]
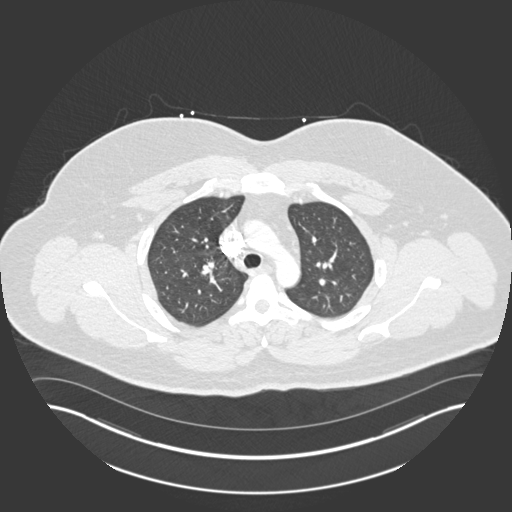
[im 266/355  soft-tissue]
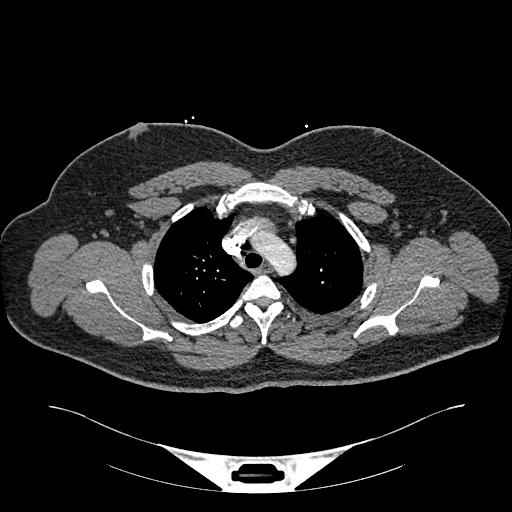
[im 296/355  lung]
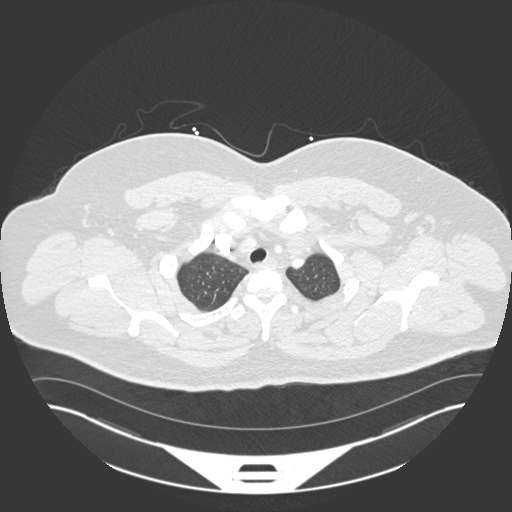
[im 310/355  soft-tissue]
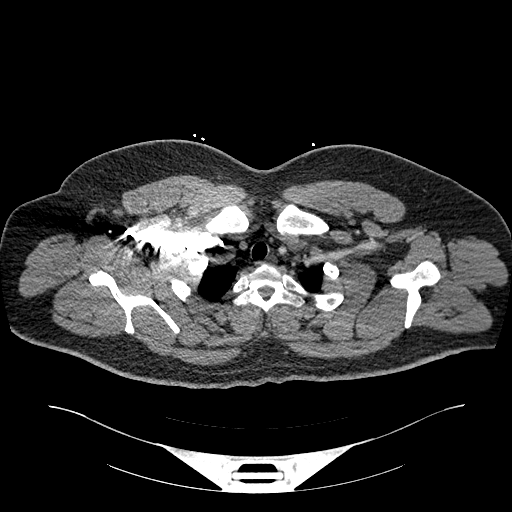
[im 340/355  lung]
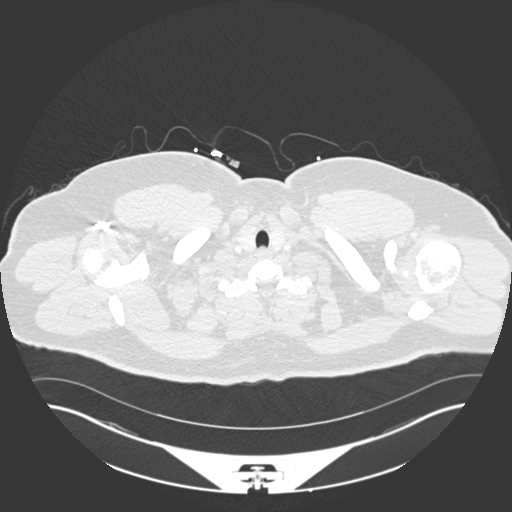

[Series 7: coronal mpr · coronal · 0.55mm/px · 3 of 106 slices shown]
[im 27/106  soft-tissue]
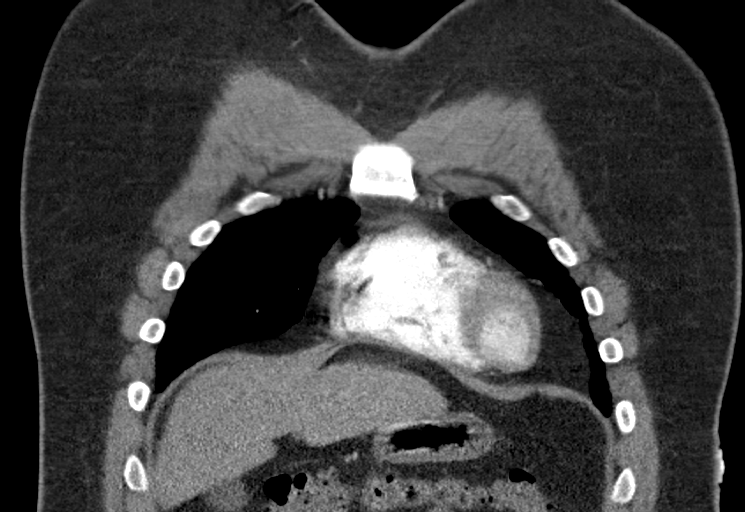
[im 53/106  soft-tissue]
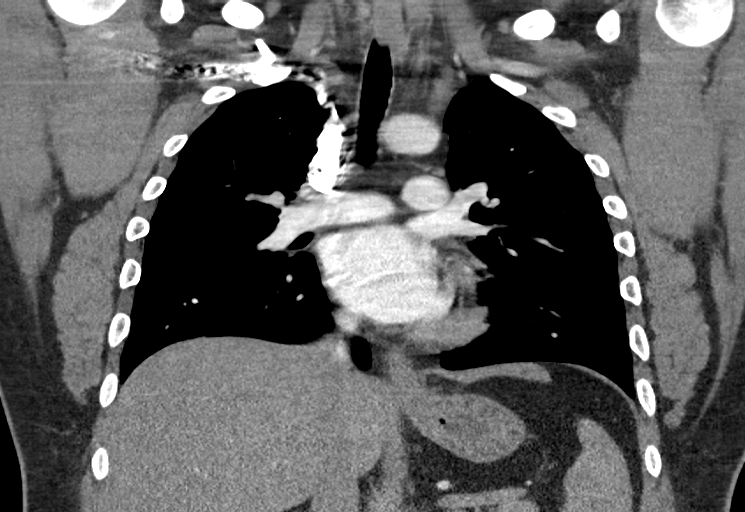
[im 79/106  soft-tissue]
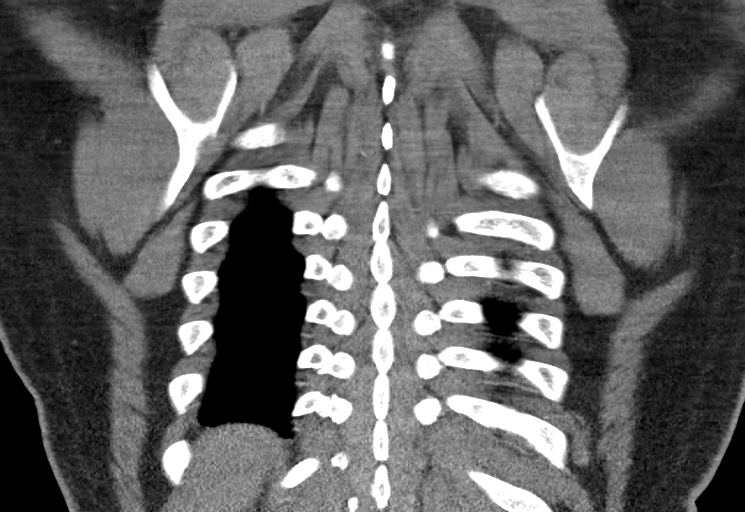

[18 of 46 positions shown; findings below may reference images not displayed]

FINDINGS: Cardiovascular: Aorta normal caliber. Heart unremarkable. No
pericardial effusion. Pulmonary arteries adequately opacified and
patent. No evidence of pulmonary embolism.

Mediastinum/Nodes: Minimal gynecomastia. Esophagus unremarkable.
Base of cervical region normal appearance. No thoracic adenopathy.

Lungs/Pleura: Lungs clear. No pulmonary infiltrate, pleural effusion
or pneumothorax.

Upper Abdomen: Unremarkable

Musculoskeletal: Unremarkable

Review of the MIP images confirms the above findings.
IMPRESSION: No evidence of pulmonary embolism.

No acute intrathoracic abnormalities.

## 2022-05-03 ENCOUNTER — Other Ambulatory Visit: Payer: Self-pay

## 2022-05-03 ENCOUNTER — Ambulatory Visit
Admission: EM | Admit: 2022-05-03 | Discharge: 2022-05-03 | Disposition: A | Discharge status: Home / Self Care | Payer: Self-pay | Attending: Physician Assistant | Admitting: Physician Assistant

## 2022-05-03 ENCOUNTER — Encounter: Payer: Self-pay | Admitting: *Deleted

## 2022-05-03 DIAGNOSIS — R8281 Pyuria: Secondary | ICD-10-CM | POA: Insufficient documentation

## 2022-05-03 DIAGNOSIS — Z202 Contact with and (suspected) exposure to infections with a predominantly sexual mode of transmission: Secondary | ICD-10-CM | POA: Insufficient documentation

## 2022-05-03 LAB — URINALYSIS, ROUTINE W REFLEX MICROSCOPIC
Bilirubin Urine: NEGATIVE
Glucose, UA: NEGATIVE mg/dL
Ketones, ur: NEGATIVE mg/dL
Nitrite: NEGATIVE
Protein, ur: NEGATIVE mg/dL
Specific Gravity, Urine: 1.025 (ref 1.005–1.030)
pH: 5.5 (ref 5.0–8.0)

## 2022-05-03 LAB — URINALYSIS, MICROSCOPIC (REFLEX): WBC, UA: >50 WBC/hpf (ref 0–5)

## 2022-05-03 MED ORDER — METRONIDAZOLE 500 MG PO TABS: 500.0000 mg | ORAL_TABLET | Freq: Two times a day (BID) | ORAL | 0 refills | Status: AC

## 2022-05-03 NOTE — ED Triage Notes (Signed)
Reports he was contacted by some because of a positive STD.

## 2022-05-03 NOTE — ED Provider Notes (Signed)
[facility]    CSN: [id number] Arrival date & time: 05/03/22  1522      History   Chief Complaint Chief Complaint  Patient presents with   Exposure to STD    HPI Antonio Peters is a 31 y.o. male presenting for evaluation after he was informed by a partner, his only partner, that she tested positive for trichomonas and he needs to be tested.  He denies any symptoms whatsoever.  He has not had any dysuria, urinary urgency, urethral discharge, testicular pain or swelling, rashes or genital lesions.  He has had trichomonas in the past.  He says he has no concern for any other STIs.  HPI  Past Medical History:  Diagnosis Date   Irregular heartbeat    a. 07/2021 occasional isolated PVCs noted on tele monitoring.   Obese    Palpitations    Pre-syncope     Patient Active Problem List   Diagnosis Date Noted   Somnolence 08/25/2021   Prediabetes 08/25/2021   AKI (acute kidney injury) (HCC)    Elevated d-dimer    Syncope and collapse 08/06/2021   Dizziness 08/06/2021   Blurred vision, left eye 08/06/2021   Rash 08/06/2021   Abnormal renal function test 08/06/2021   Elevated serum glucose 08/06/2021   Obesity 08/06/2021    History reviewed. No pertinent surgical history.     Home Medications    Prior to Admission medications   Medication Sig Start Date End Date Taking? Authorizing Provider  metroNIDAZOLE (FLAGYL) 500 MG tablet Take 1 tablet (500 mg total) by mouth 2 (two) times daily for 7 days. 05/03/22 05/10/22 Yes Shirlee Latch, PA-C    Family History Family History  Problem Relation Age of Onset   Hypertension Mother    Cataracts Mother    Coronary artery disease Father    Hypertension Father    Heart attack Father    Cataracts Sister    Hypertension Paternal Grandmother    Hypertension Paternal Grandfather     Social History Social History   Tobacco Use   Smoking status: Never   Smokeless tobacco: Never  Vaping Use   Vaping Use: Never  used  Substance Use Topics   Alcohol use: Yes    Comment: 1 drink every six months   Drug use: Never     Allergies   Patient has no known allergies.   Review of Systems Review of Systems  Constitutional:  Negative for fatigue and fever.  Gastrointestinal:  Negative for nausea and vomiting.  Genitourinary:  Negative for dysuria, frequency, genital sores, hematuria, penile discharge, penile pain, penile swelling, scrotal swelling, testicular pain and urgency.  Musculoskeletal:  Negative for arthralgias.  Skin:  Negative for rash.  Neurological:  Negative for weakness.    Physical Exam Triage Vital Signs ED Triage Vitals  Enc Vitals Group     BP 05/03/22 1703 127/76     Pulse Rate 05/03/22 1703 85     Resp 05/03/22 1703 16     Temp 05/03/22 1703 98.5 F (36.9 C)     Temp src --      SpO2 05/03/22 1703 98 %     Weight --      Height --      Head Circumference --      Peak Flow --      Pain Score 05/03/22 1700 0     Pain Loc --      Pain Edu? --  Excl. in GC? --    No data found.  Updated Vital Signs BP 127/76   Pulse 85   Temp 98.5 F (36.9 C)   Resp 16   SpO2 98%      Physical Exam Vitals and nursing note reviewed.  Constitutional:      General: He is not in acute distress.    Appearance: He is well-developed.  HENT:     Head: Normocephalic and atraumatic.  Eyes:     Conjunctiva/sclera: Conjunctivae normal.  Cardiovascular:     Rate and Rhythm: Normal rate and regular rhythm.     Heart sounds: No murmur heard. Pulmonary:     Effort: Pulmonary effort is normal. No respiratory distress.     Breath sounds: Normal breath sounds.  Abdominal:     Palpations: Abdomen is soft.     Tenderness: There is no abdominal tenderness.  Musculoskeletal:        General: No swelling.     Cervical back: Neck supple.  Skin:    General: Skin is warm and dry.     Capillary Refill: Capillary refill takes less than 2 seconds.  Neurological:     Mental Status: He  is alert.  Psychiatric:        Mood and Affect: Mood normal.     UC Treatments / Results  Labs (all labs ordered are listed, but only abnormal results are displayed) Labs Reviewed  URINALYSIS, ROUTINE W REFLEX MICROSCOPIC - Abnormal; Notable for the following components:      Result Value   APPearance HAZY (*)    Hgb urine dipstick TRACE (*)    Leukocytes,Ua MODERATE (*)    All other components within normal limits  URINALYSIS, MICROSCOPIC (REFLEX) - Abnormal; Notable for the following components:   Bacteria, UA FEW (*)    All other components within normal limits    EKG   Radiology No results found.  Procedures Procedures (including critical care time)  Medications Ordered in UC Medications - No data to display  Initial Impression / Assessment and Plan / UC Course  I have reviewed the triage vital signs and the nursing notes.  Pertinent labs & imaging results that were available during my care of the patient were reviewed by me and considered in my medical decision making (see chart for details).  31 year old male presenting for STI testing.  Reports that his partner told him she tested positive for trichomoniasis and he should get tested.  He is completely asymptomatic.  Patient would like to provide a urine sample for testing.  Advised him we may be able to see it on urinalysis but we no longer perform STI testing with urine.  Patient does not want to have a urethral swab obtained.  Urinalysis obtained at this time.  UA shows hazy appearance, trace hemoglobin and moderate leukocytes.  Few bacteria also seen in white blood cell clumps.  Unfortunately, trichomonas was not looked for under microscopic.  Discussed this with him.  Will follow expedited partner treatment guidelines per CDC and treat him for trichomonas.  Sent metronidazole to pharmacy.  Advised him if he wants to have official testing he would need to undergo the urethral swab for follow-up somewhere else that is  the urine testing.   Final Clinical Impressions(s) / UC Diagnoses   Final diagnoses:  STD exposure  Pyuria     Discharge Instructions      -You have a lot of white blood cells and bacteria in the urine.  Unfortunately, they did not look for trichomonas.  Given the findings in the urine and your exposure I am going to go ahead and treat you for it anyway.  I sent the antibiotic to pharmacy.  Complete full course.  No intercourse until you and your partner are both 1 week after the completion of your treatment. - If concern for other STIs, follow-up with the health department or return here for a urethral swab     ED Prescriptions     Medication Sig Dispense Auth. Provider   metroNIDAZOLE (FLAGYL) 500 MG tablet Take 1 tablet (500 mg total) by mouth 2 (two) times daily for 7 days. 14 tablet Gareth Morgan      PDMP not reviewed this encounter.   Shirlee Latch, PA-C 05/03/22 [phone]

## 2022-05-03 NOTE — Discharge Instructions (Addendum)
-  You have a lot of white blood cells and bacteria in the urine.  Unfortunately, they did not look for trichomonas.  Given the findings in the urine and your exposure I am going to go ahead and treat you for it anyway.  I sent the antibiotic to pharmacy.  Complete full course.  No intercourse until you and your partner are both 1 week after the completion of your treatment. - If concern for other STIs, follow-up with the health department or return here for a urethral swab

## 2022-07-31 ENCOUNTER — Other Ambulatory Visit: Payer: Self-pay

## 2022-10-25 ENCOUNTER — Encounter: Payer: Self-pay | Admitting: Cardiovascular Disease
